# Patient Record
Sex: Male | Born: 1970 | Hispanic: No | Marital: Single | Smoking: Never smoker
Health system: Southern US, Community
[De-identification: ages and names within clinical notes are randomized; demographics above are authoritative.]

## PROBLEM LIST (undated history)

## (undated) DIAGNOSIS — I2089 Other forms of angina pectoris: Secondary | ICD-10-CM

## (undated) DIAGNOSIS — G4700 Insomnia, unspecified: Secondary | ICD-10-CM

## (undated) DIAGNOSIS — E269 Hyperaldosteronism, unspecified: Secondary | ICD-10-CM

## (undated) DIAGNOSIS — I10 Essential (primary) hypertension: Secondary | ICD-10-CM

## (undated) DIAGNOSIS — F419 Anxiety disorder, unspecified: Secondary | ICD-10-CM

## (undated) DIAGNOSIS — Z8659 Personal history of other mental and behavioral disorders: Secondary | ICD-10-CM

## (undated) DIAGNOSIS — E785 Hyperlipidemia, unspecified: Secondary | ICD-10-CM

## (undated) DIAGNOSIS — G8929 Other chronic pain: Secondary | ICD-10-CM

## (undated) DIAGNOSIS — R002 Palpitations: Secondary | ICD-10-CM

## (undated) DIAGNOSIS — E119 Type 2 diabetes mellitus without complications: Secondary | ICD-10-CM

## (undated) DIAGNOSIS — I208 Other forms of angina pectoris: Secondary | ICD-10-CM

## (undated) DIAGNOSIS — F410 Panic disorder [episodic paroxysmal anxiety] without agoraphobia: Secondary | ICD-10-CM

## (undated) DIAGNOSIS — R109 Unspecified abdominal pain: Secondary | ICD-10-CM

## (undated) HISTORY — DX: Palpitations: R00.2

## (undated) HISTORY — DX: Panic disorder (episodic paroxysmal anxiety): F41.0

## (undated) HISTORY — DX: Insomnia, unspecified: G47.00

## (undated) HISTORY — DX: Type 2 diabetes mellitus without complications: E11.9

## (undated) HISTORY — DX: Unspecified abdominal pain: R10.9

## (undated) HISTORY — DX: Hyperlipidemia, unspecified: E78.5

## (undated) HISTORY — DX: Hyperaldosteronism, unspecified: E26.9

## (undated) HISTORY — DX: Essential (primary) hypertension: I10

## (undated) HISTORY — DX: Other forms of angina pectoris: I20.89

## (undated) HISTORY — DX: Anxiety disorder, unspecified: F41.9

## (undated) HISTORY — DX: Other chronic pain: G89.29

## (undated) HISTORY — DX: Other forms of angina pectoris: I20.8

## (undated) HISTORY — DX: Personal history of other mental and behavioral disorders: Z86.59

---

## 2004-05-10 HISTORY — PX: NASAL SINUS SURGERY: SHX719

## 2008-03-21 ENCOUNTER — Emergency Department (HOSPITAL_COMMUNITY): Admission: EM | Admit: 2008-03-21 | Discharge: 2008-03-21 | Payer: Self-pay | Admitting: Emergency Medicine

## 2011-02-09 LAB — DIFFERENTIAL
Basophils Absolute: 0
Basophils Relative: 0
Eosinophils Absolute: 0.1
Neutro Abs: 5.3
Neutrophils Relative %: 59

## 2011-02-09 LAB — CBC
MCHC: 33.3
MCV: 89.8
Platelets: 269

## 2011-02-09 LAB — POCT I-STAT, CHEM 8
Chloride: 104
HCT: 47
Hemoglobin: 16
Potassium: 3.7
Sodium: 138

## 2013-02-16 ENCOUNTER — Ambulatory Visit (HOSPITAL_BASED_OUTPATIENT_CLINIC_OR_DEPARTMENT_OTHER): Payer: BC Managed Care – PPO

## 2013-02-16 ENCOUNTER — Ambulatory Visit (HOSPITAL_COMMUNITY): Payer: BC Managed Care – PPO | Attending: Internal Medicine | Admitting: Radiology

## 2013-02-16 ENCOUNTER — Other Ambulatory Visit (HOSPITAL_COMMUNITY): Payer: Self-pay | Admitting: Interventional Cardiology

## 2013-02-16 DIAGNOSIS — I10 Essential (primary) hypertension: Secondary | ICD-10-CM | POA: Insufficient documentation

## 2013-02-16 DIAGNOSIS — Z01810 Encounter for preprocedural cardiovascular examination: Secondary | ICD-10-CM

## 2013-02-16 DIAGNOSIS — R0989 Other specified symptoms and signs involving the circulatory and respiratory systems: Secondary | ICD-10-CM

## 2013-02-16 DIAGNOSIS — R079 Chest pain, unspecified: Secondary | ICD-10-CM

## 2013-02-16 DIAGNOSIS — R002 Palpitations: Secondary | ICD-10-CM | POA: Insufficient documentation

## 2013-02-16 DIAGNOSIS — R072 Precordial pain: Secondary | ICD-10-CM

## 2013-02-16 NOTE — Progress Notes (Signed)
Stress Echocardiogram performed.  

## 2013-10-08 ENCOUNTER — Ambulatory Visit
Admission: RE | Admit: 2013-10-08 | Discharge: 2013-10-08 | Disposition: A | Discharge status: Home / Self Care | Payer: BC Managed Care – PPO | Source: Ambulatory Visit | Attending: Family Medicine | Admitting: Family Medicine

## 2013-10-08 ENCOUNTER — Other Ambulatory Visit: Payer: Self-pay | Admitting: Family Medicine

## 2013-10-08 DIAGNOSIS — R059 Cough, unspecified: Secondary | ICD-10-CM

## 2013-10-08 DIAGNOSIS — R05 Cough: Secondary | ICD-10-CM

## 2013-11-13 ENCOUNTER — Ambulatory Visit
Admission: RE | Admit: 2013-11-13 | Discharge: 2013-11-13 | Disposition: A | Discharge status: Home / Self Care | Payer: BC Managed Care – PPO | Source: Ambulatory Visit | Attending: Family Medicine | Admitting: Family Medicine

## 2013-11-13 ENCOUNTER — Other Ambulatory Visit: Payer: Self-pay | Admitting: Family Medicine

## 2013-11-13 DIAGNOSIS — R9389 Abnormal findings on diagnostic imaging of other specified body structures: Secondary | ICD-10-CM

## 2015-08-21 DIAGNOSIS — I10 Essential (primary) hypertension: Secondary | ICD-10-CM | POA: Diagnosis not present

## 2015-08-21 DIAGNOSIS — R079 Chest pain, unspecified: Secondary | ICD-10-CM | POA: Diagnosis not present

## 2015-08-21 DIAGNOSIS — E785 Hyperlipidemia, unspecified: Secondary | ICD-10-CM | POA: Diagnosis not present

## 2015-08-28 ENCOUNTER — Ambulatory Visit
Admission: RE | Admit: 2015-08-28 | Discharge: 2015-08-28 | Disposition: A | Discharge status: Home / Self Care | Payer: Self-pay | Source: Ambulatory Visit | Attending: Internal Medicine | Admitting: Internal Medicine

## 2015-08-28 ENCOUNTER — Other Ambulatory Visit: Payer: Self-pay | Admitting: Internal Medicine

## 2015-08-28 DIAGNOSIS — R682 Dry mouth, unspecified: Secondary | ICD-10-CM

## 2015-08-28 DIAGNOSIS — R232 Flushing: Secondary | ICD-10-CM

## 2015-08-28 DIAGNOSIS — R05 Cough: Secondary | ICD-10-CM | POA: Diagnosis not present

## 2015-08-28 DIAGNOSIS — R9389 Abnormal findings on diagnostic imaging of other specified body structures: Secondary | ICD-10-CM

## 2015-08-28 DIAGNOSIS — H04129 Dry eye syndrome of unspecified lacrimal gland: Secondary | ICD-10-CM

## 2015-08-28 DIAGNOSIS — R195 Other fecal abnormalities: Secondary | ICD-10-CM | POA: Diagnosis not present

## 2015-09-04 DIAGNOSIS — R232 Flushing: Secondary | ICD-10-CM | POA: Diagnosis not present

## 2015-09-04 DIAGNOSIS — R195 Other fecal abnormalities: Secondary | ICD-10-CM | POA: Diagnosis not present

## 2015-09-04 DIAGNOSIS — R682 Dry mouth, unspecified: Secondary | ICD-10-CM | POA: Diagnosis not present

## 2015-09-04 DIAGNOSIS — I10 Essential (primary) hypertension: Secondary | ICD-10-CM | POA: Diagnosis not present

## 2015-09-04 DIAGNOSIS — H04129 Dry eye syndrome of unspecified lacrimal gland: Secondary | ICD-10-CM | POA: Diagnosis not present

## 2015-09-08 DIAGNOSIS — R601 Generalized edema: Secondary | ICD-10-CM | POA: Diagnosis not present

## 2015-09-08 DIAGNOSIS — S142XXA Injury of nerve root of cervical spine, initial encounter: Secondary | ICD-10-CM | POA: Diagnosis not present

## 2015-09-09 DIAGNOSIS — S242XXA Injury of nerve root of thoracic spine, initial encounter: Secondary | ICD-10-CM | POA: Diagnosis not present

## 2015-09-09 DIAGNOSIS — S142XXA Injury of nerve root of cervical spine, initial encounter: Secondary | ICD-10-CM | POA: Diagnosis not present

## 2015-09-09 DIAGNOSIS — R601 Generalized edema: Secondary | ICD-10-CM | POA: Diagnosis not present

## 2015-09-10 DIAGNOSIS — R601 Generalized edema: Secondary | ICD-10-CM | POA: Diagnosis not present

## 2015-09-10 DIAGNOSIS — S142XXA Injury of nerve root of cervical spine, initial encounter: Secondary | ICD-10-CM | POA: Diagnosis not present

## 2015-09-11 DIAGNOSIS — S242XXA Injury of nerve root of thoracic spine, initial encounter: Secondary | ICD-10-CM | POA: Diagnosis not present

## 2015-09-11 DIAGNOSIS — R601 Generalized edema: Secondary | ICD-10-CM | POA: Diagnosis not present

## 2015-09-11 DIAGNOSIS — S142XXA Injury of nerve root of cervical spine, initial encounter: Secondary | ICD-10-CM | POA: Diagnosis not present

## 2015-09-15 DIAGNOSIS — R601 Generalized edema: Secondary | ICD-10-CM | POA: Diagnosis not present

## 2015-09-15 DIAGNOSIS — S142XXA Injury of nerve root of cervical spine, initial encounter: Secondary | ICD-10-CM | POA: Diagnosis not present

## 2015-09-15 DIAGNOSIS — S242XXA Injury of nerve root of thoracic spine, initial encounter: Secondary | ICD-10-CM | POA: Diagnosis not present

## 2015-09-17 DIAGNOSIS — S142XXA Injury of nerve root of cervical spine, initial encounter: Secondary | ICD-10-CM | POA: Diagnosis not present

## 2015-09-17 DIAGNOSIS — S242XXA Injury of nerve root of thoracic spine, initial encounter: Secondary | ICD-10-CM | POA: Diagnosis not present

## 2015-09-17 DIAGNOSIS — R601 Generalized edema: Secondary | ICD-10-CM | POA: Diagnosis not present

## 2015-09-18 DIAGNOSIS — S142XXA Injury of nerve root of cervical spine, initial encounter: Secondary | ICD-10-CM | POA: Diagnosis not present

## 2015-09-18 DIAGNOSIS — M5416 Radiculopathy, lumbar region: Secondary | ICD-10-CM | POA: Diagnosis not present

## 2015-09-18 DIAGNOSIS — R601 Generalized edema: Secondary | ICD-10-CM | POA: Diagnosis not present

## 2015-09-22 DIAGNOSIS — R601 Generalized edema: Secondary | ICD-10-CM | POA: Diagnosis not present

## 2015-09-22 DIAGNOSIS — S242XXA Injury of nerve root of thoracic spine, initial encounter: Secondary | ICD-10-CM | POA: Diagnosis not present

## 2015-09-22 DIAGNOSIS — M5106 Intervertebral disc disorders with myelopathy, lumbar region: Secondary | ICD-10-CM | POA: Diagnosis not present

## 2015-09-22 DIAGNOSIS — S142XXA Injury of nerve root of cervical spine, initial encounter: Secondary | ICD-10-CM | POA: Diagnosis not present

## 2015-09-24 DIAGNOSIS — M5416 Radiculopathy, lumbar region: Secondary | ICD-10-CM | POA: Diagnosis not present

## 2015-09-24 DIAGNOSIS — R601 Generalized edema: Secondary | ICD-10-CM | POA: Diagnosis not present

## 2015-09-24 DIAGNOSIS — S242XXA Injury of nerve root of thoracic spine, initial encounter: Secondary | ICD-10-CM | POA: Diagnosis not present

## 2015-09-24 DIAGNOSIS — S142XXA Injury of nerve root of cervical spine, initial encounter: Secondary | ICD-10-CM | POA: Diagnosis not present

## 2015-09-25 DIAGNOSIS — R601 Generalized edema: Secondary | ICD-10-CM | POA: Diagnosis not present

## 2015-09-25 DIAGNOSIS — M5106 Intervertebral disc disorders with myelopathy, lumbar region: Secondary | ICD-10-CM | POA: Diagnosis not present

## 2015-09-25 DIAGNOSIS — M5416 Radiculopathy, lumbar region: Secondary | ICD-10-CM | POA: Diagnosis not present

## 2015-09-25 DIAGNOSIS — S142XXA Injury of nerve root of cervical spine, initial encounter: Secondary | ICD-10-CM | POA: Diagnosis not present

## 2015-09-29 DIAGNOSIS — S142XXA Injury of nerve root of cervical spine, initial encounter: Secondary | ICD-10-CM | POA: Diagnosis not present

## 2015-09-29 DIAGNOSIS — R601 Generalized edema: Secondary | ICD-10-CM | POA: Diagnosis not present

## 2015-09-29 DIAGNOSIS — M5416 Radiculopathy, lumbar region: Secondary | ICD-10-CM | POA: Diagnosis not present

## 2015-09-30 DIAGNOSIS — I10 Essential (primary) hypertension: Secondary | ICD-10-CM | POA: Diagnosis not present

## 2015-09-30 DIAGNOSIS — E876 Hypokalemia: Secondary | ICD-10-CM | POA: Diagnosis not present

## 2015-09-30 DIAGNOSIS — R799 Abnormal finding of blood chemistry, unspecified: Secondary | ICD-10-CM | POA: Diagnosis not present

## 2015-10-01 DIAGNOSIS — S242XXA Injury of nerve root of thoracic spine, initial encounter: Secondary | ICD-10-CM | POA: Diagnosis not present

## 2015-10-01 DIAGNOSIS — M5106 Intervertebral disc disorders with myelopathy, lumbar region: Secondary | ICD-10-CM | POA: Diagnosis not present

## 2015-10-01 DIAGNOSIS — S142XXA Injury of nerve root of cervical spine, initial encounter: Secondary | ICD-10-CM | POA: Diagnosis not present

## 2015-10-01 DIAGNOSIS — R601 Generalized edema: Secondary | ICD-10-CM | POA: Diagnosis not present

## 2015-10-01 DIAGNOSIS — M5416 Radiculopathy, lumbar region: Secondary | ICD-10-CM | POA: Diagnosis not present

## 2015-10-02 DIAGNOSIS — S242XXA Injury of nerve root of thoracic spine, initial encounter: Secondary | ICD-10-CM | POA: Diagnosis not present

## 2015-10-02 DIAGNOSIS — S142XXA Injury of nerve root of cervical spine, initial encounter: Secondary | ICD-10-CM | POA: Diagnosis not present

## 2015-10-02 DIAGNOSIS — R601 Generalized edema: Secondary | ICD-10-CM | POA: Diagnosis not present

## 2015-10-08 DIAGNOSIS — S242XXA Injury of nerve root of thoracic spine, initial encounter: Secondary | ICD-10-CM | POA: Diagnosis not present

## 2015-10-08 DIAGNOSIS — M5416 Radiculopathy, lumbar region: Secondary | ICD-10-CM | POA: Diagnosis not present

## 2015-10-08 DIAGNOSIS — M5106 Intervertebral disc disorders with myelopathy, lumbar region: Secondary | ICD-10-CM | POA: Diagnosis not present

## 2015-10-08 DIAGNOSIS — R601 Generalized edema: Secondary | ICD-10-CM | POA: Diagnosis not present

## 2015-10-08 DIAGNOSIS — S142XXA Injury of nerve root of cervical spine, initial encounter: Secondary | ICD-10-CM | POA: Diagnosis not present

## 2015-10-09 DIAGNOSIS — S242XXA Injury of nerve root of thoracic spine, initial encounter: Secondary | ICD-10-CM | POA: Diagnosis not present

## 2015-10-09 DIAGNOSIS — M5106 Intervertebral disc disorders with myelopathy, lumbar region: Secondary | ICD-10-CM | POA: Diagnosis not present

## 2015-10-09 DIAGNOSIS — S142XXA Injury of nerve root of cervical spine, initial encounter: Secondary | ICD-10-CM | POA: Diagnosis not present

## 2015-10-09 DIAGNOSIS — M5416 Radiculopathy, lumbar region: Secondary | ICD-10-CM | POA: Diagnosis not present

## 2015-10-09 DIAGNOSIS — R601 Generalized edema: Secondary | ICD-10-CM | POA: Diagnosis not present

## 2015-10-13 DIAGNOSIS — S142XXA Injury of nerve root of cervical spine, initial encounter: Secondary | ICD-10-CM | POA: Diagnosis not present

## 2015-10-13 DIAGNOSIS — M5106 Intervertebral disc disorders with myelopathy, lumbar region: Secondary | ICD-10-CM | POA: Diagnosis not present

## 2015-10-13 DIAGNOSIS — S242XXA Injury of nerve root of thoracic spine, initial encounter: Secondary | ICD-10-CM | POA: Diagnosis not present

## 2015-10-13 DIAGNOSIS — M5416 Radiculopathy, lumbar region: Secondary | ICD-10-CM | POA: Diagnosis not present

## 2015-10-13 DIAGNOSIS — R601 Generalized edema: Secondary | ICD-10-CM | POA: Diagnosis not present

## 2015-10-15 DIAGNOSIS — R601 Generalized edema: Secondary | ICD-10-CM | POA: Diagnosis not present

## 2015-10-15 DIAGNOSIS — S142XXA Injury of nerve root of cervical spine, initial encounter: Secondary | ICD-10-CM | POA: Diagnosis not present

## 2015-10-16 DIAGNOSIS — M9902 Segmental and somatic dysfunction of thoracic region: Secondary | ICD-10-CM | POA: Diagnosis not present

## 2015-10-16 DIAGNOSIS — S142XXA Injury of nerve root of cervical spine, initial encounter: Secondary | ICD-10-CM | POA: Diagnosis not present

## 2015-10-16 DIAGNOSIS — R601 Generalized edema: Secondary | ICD-10-CM | POA: Diagnosis not present

## 2015-10-16 DIAGNOSIS — S242XXA Injury of nerve root of thoracic spine, initial encounter: Secondary | ICD-10-CM | POA: Diagnosis not present

## 2015-10-16 DIAGNOSIS — M5106 Intervertebral disc disorders with myelopathy, lumbar region: Secondary | ICD-10-CM | POA: Diagnosis not present

## 2015-10-16 DIAGNOSIS — M9901 Segmental and somatic dysfunction of cervical region: Secondary | ICD-10-CM | POA: Diagnosis not present

## 2015-10-16 DIAGNOSIS — M791 Myalgia: Secondary | ICD-10-CM | POA: Diagnosis not present

## 2015-10-17 DIAGNOSIS — R601 Generalized edema: Secondary | ICD-10-CM | POA: Diagnosis not present

## 2015-10-17 DIAGNOSIS — S142XXA Injury of nerve root of cervical spine, initial encounter: Secondary | ICD-10-CM | POA: Diagnosis not present

## 2015-10-21 DIAGNOSIS — R799 Abnormal finding of blood chemistry, unspecified: Secondary | ICD-10-CM | POA: Diagnosis not present

## 2015-10-21 DIAGNOSIS — E876 Hypokalemia: Secondary | ICD-10-CM | POA: Diagnosis not present

## 2015-10-27 DIAGNOSIS — M542 Cervicalgia: Secondary | ICD-10-CM | POA: Diagnosis not present

## 2015-10-27 DIAGNOSIS — M5134 Other intervertebral disc degeneration, thoracic region: Secondary | ICD-10-CM | POA: Diagnosis not present

## 2015-10-27 DIAGNOSIS — G44201 Tension-type headache, unspecified, intractable: Secondary | ICD-10-CM | POA: Diagnosis not present

## 2015-10-27 DIAGNOSIS — M50322 Other cervical disc degeneration at C5-C6 level: Secondary | ICD-10-CM | POA: Diagnosis not present

## 2015-10-29 DIAGNOSIS — M5134 Other intervertebral disc degeneration, thoracic region: Secondary | ICD-10-CM | POA: Diagnosis not present

## 2015-10-29 DIAGNOSIS — G44201 Tension-type headache, unspecified, intractable: Secondary | ICD-10-CM | POA: Diagnosis not present

## 2015-10-29 DIAGNOSIS — M50322 Other cervical disc degeneration at C5-C6 level: Secondary | ICD-10-CM | POA: Diagnosis not present

## 2015-10-29 DIAGNOSIS — M542 Cervicalgia: Secondary | ICD-10-CM | POA: Diagnosis not present

## 2015-10-30 DIAGNOSIS — G44201 Tension-type headache, unspecified, intractable: Secondary | ICD-10-CM | POA: Diagnosis not present

## 2015-10-30 DIAGNOSIS — M542 Cervicalgia: Secondary | ICD-10-CM | POA: Diagnosis not present

## 2015-10-30 DIAGNOSIS — M50322 Other cervical disc degeneration at C5-C6 level: Secondary | ICD-10-CM | POA: Diagnosis not present

## 2015-10-30 DIAGNOSIS — M5134 Other intervertebral disc degeneration, thoracic region: Secondary | ICD-10-CM | POA: Diagnosis not present

## 2015-11-03 DIAGNOSIS — M542 Cervicalgia: Secondary | ICD-10-CM | POA: Diagnosis not present

## 2015-11-03 DIAGNOSIS — G44201 Tension-type headache, unspecified, intractable: Secondary | ICD-10-CM | POA: Diagnosis not present

## 2015-11-03 DIAGNOSIS — M50322 Other cervical disc degeneration at C5-C6 level: Secondary | ICD-10-CM | POA: Diagnosis not present

## 2015-11-03 DIAGNOSIS — M5134 Other intervertebral disc degeneration, thoracic region: Secondary | ICD-10-CM | POA: Diagnosis not present

## 2015-11-06 DIAGNOSIS — M50322 Other cervical disc degeneration at C5-C6 level: Secondary | ICD-10-CM | POA: Diagnosis not present

## 2015-11-06 DIAGNOSIS — G44201 Tension-type headache, unspecified, intractable: Secondary | ICD-10-CM | POA: Diagnosis not present

## 2015-11-06 DIAGNOSIS — M542 Cervicalgia: Secondary | ICD-10-CM | POA: Diagnosis not present

## 2015-11-06 DIAGNOSIS — M5134 Other intervertebral disc degeneration, thoracic region: Secondary | ICD-10-CM | POA: Diagnosis not present

## 2015-11-17 DIAGNOSIS — M50322 Other cervical disc degeneration at C5-C6 level: Secondary | ICD-10-CM | POA: Diagnosis not present

## 2015-11-17 DIAGNOSIS — M5134 Other intervertebral disc degeneration, thoracic region: Secondary | ICD-10-CM | POA: Diagnosis not present

## 2015-11-17 DIAGNOSIS — G44201 Tension-type headache, unspecified, intractable: Secondary | ICD-10-CM | POA: Diagnosis not present

## 2015-11-18 ENCOUNTER — Other Ambulatory Visit: Payer: Self-pay | Admitting: Internal Medicine

## 2015-11-18 DIAGNOSIS — E876 Hypokalemia: Secondary | ICD-10-CM | POA: Diagnosis not present

## 2015-11-18 DIAGNOSIS — I10 Essential (primary) hypertension: Secondary | ICD-10-CM | POA: Diagnosis not present

## 2015-11-18 DIAGNOSIS — R7301 Impaired fasting glucose: Secondary | ICD-10-CM | POA: Diagnosis not present

## 2015-11-18 DIAGNOSIS — E2609 Other primary hyperaldosteronism: Secondary | ICD-10-CM | POA: Diagnosis not present

## 2015-11-20 DIAGNOSIS — G44201 Tension-type headache, unspecified, intractable: Secondary | ICD-10-CM | POA: Diagnosis not present

## 2015-11-20 DIAGNOSIS — M542 Cervicalgia: Secondary | ICD-10-CM | POA: Diagnosis not present

## 2015-11-20 DIAGNOSIS — M5134 Other intervertebral disc degeneration, thoracic region: Secondary | ICD-10-CM | POA: Diagnosis not present

## 2015-11-28 ENCOUNTER — Other Ambulatory Visit: Payer: BLUE CROSS/BLUE SHIELD

## 2015-11-28 DIAGNOSIS — E785 Hyperlipidemia, unspecified: Secondary | ICD-10-CM | POA: Diagnosis not present

## 2015-11-28 DIAGNOSIS — Z0000 Encounter for general adult medical examination without abnormal findings: Secondary | ICD-10-CM | POA: Diagnosis not present

## 2015-11-28 DIAGNOSIS — R7301 Impaired fasting glucose: Secondary | ICD-10-CM | POA: Diagnosis not present

## 2015-11-28 DIAGNOSIS — E2609 Other primary hyperaldosteronism: Secondary | ICD-10-CM | POA: Diagnosis not present

## 2015-12-01 ENCOUNTER — Other Ambulatory Visit: Payer: BLUE CROSS/BLUE SHIELD

## 2015-12-01 DIAGNOSIS — G959 Disease of spinal cord, unspecified: Secondary | ICD-10-CM | POA: Diagnosis not present

## 2015-12-01 DIAGNOSIS — M542 Cervicalgia: Secondary | ICD-10-CM | POA: Diagnosis not present

## 2015-12-01 DIAGNOSIS — M5412 Radiculopathy, cervical region: Secondary | ICD-10-CM | POA: Diagnosis not present

## 2015-12-03 ENCOUNTER — Ambulatory Visit
Admission: RE | Admit: 2015-12-03 | Discharge: 2015-12-03 | Disposition: A | Discharge status: Home / Self Care | Payer: BLUE CROSS/BLUE SHIELD | Source: Ambulatory Visit | Attending: Internal Medicine | Admitting: Internal Medicine

## 2015-12-03 DIAGNOSIS — E2609 Other primary hyperaldosteronism: Secondary | ICD-10-CM

## 2015-12-03 DIAGNOSIS — N200 Calculus of kidney: Secondary | ICD-10-CM | POA: Diagnosis not present

## 2015-12-05 DIAGNOSIS — M4802 Spinal stenosis, cervical region: Secondary | ICD-10-CM | POA: Diagnosis not present

## 2015-12-05 DIAGNOSIS — G959 Disease of spinal cord, unspecified: Secondary | ICD-10-CM | POA: Diagnosis not present

## 2015-12-17 DIAGNOSIS — R7301 Impaired fasting glucose: Secondary | ICD-10-CM | POA: Diagnosis not present

## 2015-12-24 DIAGNOSIS — E876 Hypokalemia: Secondary | ICD-10-CM | POA: Diagnosis not present

## 2015-12-24 DIAGNOSIS — I10 Essential (primary) hypertension: Secondary | ICD-10-CM | POA: Diagnosis not present

## 2015-12-24 DIAGNOSIS — E2609 Other primary hyperaldosteronism: Secondary | ICD-10-CM | POA: Diagnosis not present

## 2015-12-24 DIAGNOSIS — N200 Calculus of kidney: Secondary | ICD-10-CM | POA: Diagnosis not present

## 2015-12-31 DIAGNOSIS — E2609 Other primary hyperaldosteronism: Secondary | ICD-10-CM | POA: Diagnosis not present

## 2016-01-13 DIAGNOSIS — N200 Calculus of kidney: Secondary | ICD-10-CM | POA: Diagnosis not present

## 2016-01-14 DIAGNOSIS — M25561 Pain in right knee: Secondary | ICD-10-CM | POA: Diagnosis not present

## 2016-01-28 DIAGNOSIS — G959 Disease of spinal cord, unspecified: Secondary | ICD-10-CM | POA: Diagnosis not present

## 2016-01-28 DIAGNOSIS — M5412 Radiculopathy, cervical region: Secondary | ICD-10-CM | POA: Diagnosis not present

## 2016-01-28 DIAGNOSIS — M542 Cervicalgia: Secondary | ICD-10-CM | POA: Diagnosis not present

## 2016-01-28 DIAGNOSIS — Z6829 Body mass index (BMI) 29.0-29.9, adult: Secondary | ICD-10-CM | POA: Diagnosis not present

## 2016-03-01 DIAGNOSIS — R51 Headache: Secondary | ICD-10-CM | POA: Diagnosis not present

## 2016-05-04 DIAGNOSIS — Z87898 Personal history of other specified conditions: Secondary | ICD-10-CM | POA: Diagnosis not present

## 2016-05-04 DIAGNOSIS — E876 Hypokalemia: Secondary | ICD-10-CM | POA: Diagnosis not present

## 2016-05-04 DIAGNOSIS — E2609 Other primary hyperaldosteronism: Secondary | ICD-10-CM | POA: Diagnosis not present

## 2016-05-04 DIAGNOSIS — K8020 Calculus of gallbladder without cholecystitis without obstruction: Secondary | ICD-10-CM | POA: Diagnosis not present

## 2016-05-04 DIAGNOSIS — N200 Calculus of kidney: Secondary | ICD-10-CM | POA: Diagnosis not present

## 2016-05-18 DIAGNOSIS — R079 Chest pain, unspecified: Secondary | ICD-10-CM | POA: Diagnosis not present

## 2016-05-18 DIAGNOSIS — I10 Essential (primary) hypertension: Secondary | ICD-10-CM | POA: Diagnosis not present

## 2016-05-19 ENCOUNTER — Telehealth: Payer: Self-pay

## 2016-05-19 NOTE — Telephone Encounter (Signed)
SENT NOTES TO SCHEDULING 

## 2016-06-21 DIAGNOSIS — I10 Essential (primary) hypertension: Secondary | ICD-10-CM | POA: Diagnosis not present

## 2016-07-09 DIAGNOSIS — E785 Hyperlipidemia, unspecified: Secondary | ICD-10-CM | POA: Diagnosis not present

## 2016-07-26 DIAGNOSIS — I10 Essential (primary) hypertension: Secondary | ICD-10-CM | POA: Diagnosis not present

## 2016-10-20 DIAGNOSIS — H60503 Unspecified acute noninfective otitis externa, bilateral: Secondary | ICD-10-CM | POA: Diagnosis not present

## 2016-10-20 DIAGNOSIS — I10 Essential (primary) hypertension: Secondary | ICD-10-CM | POA: Diagnosis not present

## 2016-10-20 DIAGNOSIS — N62 Hypertrophy of breast: Secondary | ICD-10-CM | POA: Diagnosis not present

## 2016-10-22 ENCOUNTER — Other Ambulatory Visit: Payer: Self-pay | Admitting: Family Medicine

## 2016-10-22 DIAGNOSIS — N63 Unspecified lump in unspecified breast: Secondary | ICD-10-CM

## 2016-10-22 DIAGNOSIS — N644 Mastodynia: Secondary | ICD-10-CM

## 2016-10-25 ENCOUNTER — Other Ambulatory Visit: Payer: Self-pay | Admitting: Gastroenterology

## 2016-10-27 ENCOUNTER — Ambulatory Visit
Admission: RE | Admit: 2016-10-27 | Discharge: 2016-10-27 | Disposition: A | Discharge status: Home / Self Care | Payer: BLUE CROSS/BLUE SHIELD | Source: Ambulatory Visit | Attending: Family Medicine | Admitting: Family Medicine

## 2016-10-27 DIAGNOSIS — N63 Unspecified lump in unspecified breast: Secondary | ICD-10-CM

## 2016-10-27 DIAGNOSIS — N644 Mastodynia: Secondary | ICD-10-CM

## 2016-10-27 DIAGNOSIS — R928 Other abnormal and inconclusive findings on diagnostic imaging of breast: Secondary | ICD-10-CM | POA: Diagnosis not present

## 2016-10-27 DIAGNOSIS — N62 Hypertrophy of breast: Secondary | ICD-10-CM | POA: Diagnosis not present

## 2016-11-15 DIAGNOSIS — R079 Chest pain, unspecified: Secondary | ICD-10-CM | POA: Diagnosis not present

## 2016-11-18 ENCOUNTER — Other Ambulatory Visit: Payer: Self-pay | Admitting: Gastroenterology

## 2016-11-18 DIAGNOSIS — R079 Chest pain, unspecified: Secondary | ICD-10-CM

## 2016-11-23 ENCOUNTER — Telehealth: Payer: Self-pay

## 2016-11-23 ENCOUNTER — Encounter: Payer: Self-pay | Admitting: Nurse Practitioner

## 2016-11-23 ENCOUNTER — Ambulatory Visit: Payer: BLUE CROSS/BLUE SHIELD | Attending: Nurse Practitioner | Admitting: Nurse Practitioner

## 2016-11-23 VITALS — BP 138/83 | HR 86 | Temp 98.1°F | Resp 16 | Ht 68.0 in | Wt 192.0 lb

## 2016-11-23 DIAGNOSIS — M5481 Occipital neuralgia: Secondary | ICD-10-CM | POA: Diagnosis not present

## 2016-11-23 DIAGNOSIS — R200 Anesthesia of skin: Secondary | ICD-10-CM | POA: Diagnosis not present

## 2016-11-23 DIAGNOSIS — Z79899 Other long term (current) drug therapy: Secondary | ICD-10-CM | POA: Insufficient documentation

## 2016-11-23 DIAGNOSIS — R51 Headache: Secondary | ICD-10-CM

## 2016-11-23 DIAGNOSIS — F1190 Opioid use, unspecified, uncomplicated: Secondary | ICD-10-CM | POA: Diagnosis not present

## 2016-11-23 DIAGNOSIS — R519 Headache, unspecified: Secondary | ICD-10-CM

## 2016-11-23 DIAGNOSIS — Z87442 Personal history of urinary calculi: Secondary | ICD-10-CM | POA: Insufficient documentation

## 2016-11-23 DIAGNOSIS — Z79891 Long term (current) use of opiate analgesic: Secondary | ICD-10-CM | POA: Insufficient documentation

## 2016-11-23 DIAGNOSIS — M542 Cervicalgia: Secondary | ICD-10-CM | POA: Insufficient documentation

## 2016-11-23 DIAGNOSIS — I10 Essential (primary) hypertension: Secondary | ICD-10-CM | POA: Diagnosis not present

## 2016-11-23 DIAGNOSIS — G8929 Other chronic pain: Secondary | ICD-10-CM

## 2016-11-23 DIAGNOSIS — G894 Chronic pain syndrome: Secondary | ICD-10-CM | POA: Insufficient documentation

## 2016-11-23 DIAGNOSIS — K219 Gastro-esophageal reflux disease without esophagitis: Secondary | ICD-10-CM | POA: Diagnosis not present

## 2016-11-23 NOTE — Telephone Encounter (Signed)
Faxed auth of release form to [facility] for pt medical records

## 2016-11-23 NOTE — Progress Notes (Addendum)
Patient's Name: Maxwell Richards  MRN: [id number]  Referring Provider: Normajean Glasgow, MD  DOB: 18-Jul-1970  PCP: Maurice Small, MD  DOS: 11/23/2016  Note by: Dionisio David NP  Service setting: Ambulatory outpatient  Specialty: Interventional Pain Management  Location: [facility] (AMB) Pain Management Facility    Patient type: New Patient    Primary Reason(s) for Visit: Initial Patient Evaluation CC: Neck Pain (occipital neuralgia)  HPI  Mr. Butrick is a 46 y.o. year old, male patient, who comes today for an initial evaluation. He has Opiate use; Long term current use of opiate analgesic; Long term prescription benzodiazepine use; Chronic pain syndrome; Chronic neck pain (Secondary Area of pain) (Bilateral) (L>R); Chronic head pain (Primary Area of Pain) (Left); Chronic shoulder pain [facility] Area of pain) (Bilateral) (L>R); Cervicogenic headache; Occipital headache; and Radicular pain of shoulder on his problem list.. His primarily concern today is the Neck Pain (occipital neuralgia)  Pain Assessment: Location:   Neck Radiating: back of head and to back of both eyes Onset: More than a month ago Duration: Chronic pain Quality: Pressure, Cramping, Constant Severity: 2 /10 (self-reported pain score)  Note: Reported level is compatible with observation.                   Effect on ADL:   Timing: Constant Modifying factors: nerve block, chiropractic manipulations  Onset and Duration: Sudden, Date of injury: january 1990 and Present longer than 3 months Cause of pain: Head hit bunk bed in college dormitory Severity: No change since onset, NAS-11 at its worse: 10/10, NAS-11 at its best: 2/10, NAS-11 now: 2/10 and NAS-11 on the average: 3/10 Timing: Not influenced by the time of the day Aggravating Factors: Motion Alleviating Factors: Nerve blocks and Chiropractic manipulations Associated Problems: Numbness, tingling, excessive dryness Quality of Pain: Annoying, Cramping, Dull, Pressure-like and  Uncomfortable Previous Examinations or Tests: MRI scan, Nerve block, X-rays and Chiropractic evaluation Previous Treatments: Chiropractic manipulations and Stretching exercises  The patient comes into the clinics today for the first time for a chronic pain management evaluation. According to the patient's primary area of pain is in the top of his head. He admitted that at the age of 20 he admits that he suffered a blow to this area while getting up from the lower level of a bunk bed. He admits that he tolerated the pain for several years and was seen by many eye doctors for pain behind his eyes. He admits that he did have a surgical procedure by plastic surgeon to look at the occipital nerve . He admits that it was initially going to be cut the nerve to help relieve his pain. He admits that he has numbness with radicular pain that goes around his face behind his eye. The pain also radiates down into his neck with tightness in the shoulders. He admits that he has ringing in ear. The left side is greater than the right. He denies any interventional procedures. He has had Chinese coin scraping which made the pain worse. He currently uses an activator method technique used by chiropractors. He states this is effective for a while but then returns.  His second area of pain is his neck. He admits the left is greater than the right. He states that he does radiate down into her shoulders. He denies any previous procedures, physical therapy but has had recent images.  Third area of pain is in his shoulders with left being greater than the right. He denies any previous  surgeries, physical therapy. He admits that he has had x-rays of his shoulders.  Today I took the time to provide the patient with information regarding this pain practice. The patient was informed that the practice is divided into two sections: an interventional pain management section, as well as a completely separate and distinct medication  management section. I explained that there are procedure days for interventional therapies, and evaluation days for follow-ups and medication management. Because of the amount of documentation required during both, they are kept separated. This means that there is the possibility that he may be scheduled for a procedure on one day, and medication management the next. I have also informed him that because of staffing and facility limitations, this practice will no longer take patients for medication management only. To illustrate the reasons for this, I gave the patient the example of surgeons, and how inappropriate it would be to refer a patient to his/her care, just to write for the post-surgical antibiotics on a surgery done by a different surgeon.   Because interventional pain management is part of the board-certified specialty for the doctors, the patient was informed that joining this practice means that they are open to any and all interventional therapies. I made it clear that this does not mean that they will be forced to have any procedures done. What this means is that I believe interventional therapies to be essential part of the diagnosis and proper management of chronic pain conditions. Therefore, patients not interested in these interventional alternatives will be better served under the care of a different practitioner.  The patient was also made aware of my Comprehensive Pain Management Safety Guidelines where by joining this practice, they limit all of their nerve blocks and joint injections to those done by our practice, for as long as we are retained to manage their care. Historic Controlled Substance Pharmacotherapy Review  PMP and historical list of controlled substances: Lorazepam 1 mg, clonazepam 0.5 mg, hydrocodone/acetaminophen 5/325 mg, promethazine with codeine syrup, clonazepam 1 mg, Lyrica 75 mg Highest opioid analgesic regimen found: Hydrocodone/acetaminophen 10/325 mg 6 times  daily (hydrocodone 60 mg) (fill day 12/26/2013) Most recent opioid analgesic: Hydrocodone/acetaminophen 5/325 mg 6 times daily (hydrocodone 30 mg) (fill date for word 24th 2016) Current opioid analgesics: None Highest recorded MME/day: 60 mg/day MME/day: 0 mg/day Medications: The patient did not bring the medication(s) to the appointment, as requested in our "New Patient Package" Pharmacodynamics: Desired effects: Analgesia: The patient reports >50% benefit. Reported improvement in function: The patient reports medication allows him to accomplish basic ADLs. Clinically meaningful improvement in function (CMIF): Sustained CMIF goals met Perceived effectiveness: Described as relatively effective, allowing for increase in activities of daily living (ADL) Undesirable effects: Side-effects or Adverse reactions: None reported Historical Monitoring: The patient  has no drug history on file. List of all UDS Test(s): No results found for: MDMA, COCAINSCRNUR, PCPSCRNUR, PCPQUANT, CANNABQUANT, THCU, [place] List of all Serum Drug Screening Test(s):  No results found for: AMPHSCRSER, BARBSCRSER, BENZOSCRSER, COCAINSCRSER, PCPSCRSER, PCPQUANT, THCSCRSER, CANNABQUANT, OPIATESCRSER, OXYSCRSER, PROPOXSCRSER Historical Background Evaluation: [place] PDMP: Six (6) year initial data search conducted.             [place] Department of public safety, offender search: Editor, commissioning Information) Non-contributory Risk Assessment Profile: Aberrant behavior: None observed or detected today Risk factors for fatal opioid overdose: age 76-47 years old, Benzodiazepine use, liver disease and male gender Fatal overdose hazard ratio (HR): 1.92 for 50-99 MME/day  Non-fatal overdose hazard ratio (HR): 1.44  for 20-49 MME/day Risk of opioid abuse or dependence: 0.7-3.0% with doses ? 36 MME/day and 6.1-26% with doses ? 120 MME/day. Substance use disorder (SUD) risk level: Pending results of Medical Psychology Evaluation for SUD Opioid risk  tool (ORT) (Total Score): 3  ORT Scoring interpretation table:  Score <3 = Low Risk for SUD  Score between 4-7 = Moderate Risk for SUD  Score >8 = High Risk for Opioid Abuse   PHQ-2 Depression Scale:  Total score: 0  PHQ-2 Scoring interpretation table: (Score and probability of major depressive disorder)  Score 0 = No depression  Score 1 = 15.4% Probability  Score 2 = 21.1% Probability  Score 3 = 38.4% Probability  Score 4 = 45.5% Probability  Score 5 = 56.4% Probability  Score 6 = 78.6% Probability   PHQ-9 Depression Scale:  Total score: 0  PHQ-9 Scoring interpretation table:  Score 0-4 = No depression  Score 5-9 = Mild depression  Score 10-14 = Moderate depression  Score 15-19 = Moderately severe depression  Score 20-27 = Severe depression (2.4 times higher risk of SUD and 2.89 times higher risk of overuse)   Pharmacologic Plan: Pending ordered tests and/or consults  Meds  The patient has a current medication list which includes the following prescription(s): amlodipine, lorazepam, and paroxetine.  No current outpatient prescriptions on file prior to visit.   No current facility-administered medications on file prior to visit.    Imaging Review   Note: No new results found. Medical records to be obtained      ROS  Cardiovascular History: High blood pressure Pulmonary or Respiratory History: Snoring  Neurological History: No reported neurological signs or symptoms such as seizures, abnormal skin sensations, urinary and/or fecal incontinence, being born with an abnormal open spine and/or a tethered spinal cord Review of Past Neurological Studies:  Results for orders placed or performed during the hospital encounter of 03/21/08  CT Head Wo Contrast   Narrative   Clinical Data:  Headache and numbness.  Hypertension.   CT HEAD WITHOUT CONTRAST   Technique: Contiguous axial images were obtained from the base of the skull through the vertex without contrast    Comparison: None   Findings:  There is no evidence of intracranial hemorrhage, brain edema, or other signs of acute infarction.  There is no evidence of intracranial mass lesions, or mass effect.  No abnormal extraaxial fluid collections are identified.  There is no evidence of hydrocephalus, or other significant intracranial abnormality.  No skull abnormality identified.   IMPRESSION: Negative non-contrast head CT.  Provider: Guadlupe Spanish   Psychological-Psychiatric History: Anxiousness and Prone to panicking Gastrointestinal History: Reflux or heatburn Genitourinary History: Passing kidney stones Hematological History: No reported hematological signs or symptoms such as prolonged bleeding, low or poor functioning platelets, bruising or bleeding easily, hereditary bleeding problems, low energy levels due to low hemoglobin or being anemic Endocrine History: No reported endocrine signs or symptoms such as high or low blood sugar, rapid heart rate due to high thyroid levels, obesity or weight gain due to slow thyroid or thyroid disease Rheumatologic History: No reported rheumatological signs and symptoms such as fatigue, joint pain, tenderness, swelling, redness, heat, stiffness, decreased range of motion, with or without associated rash Musculoskeletal History: Negative for myasthenia gravis, muscular dystrophy, multiple sclerosis or malignant hyperthermia Work History: Working full time  Allergies  Mr. Pellow has no allergies on file.  Laboratory Chemistry  Inflammation Markers Lab Results  Component Value Date   CRP  2.5 11/23/2016   ESRSEDRATE 7 11/23/2016   (CRP: Acute Phase) (ESR: Chronic Phase) Renal Function Markers Lab Results  Component Value Date   BUN 11 11/23/2016   CREATININE 0.85 11/23/2016   GFRAA 120 11/23/2016   GFRNONAA 104 11/23/2016   Hepatic Function Markers Lab Results  Component Value Date   AST 52 (H) 11/23/2016   ALT 67 (H) 11/23/2016   ALBUMIN  4.4 11/23/2016   ALKPHOS 88 11/23/2016   Electrolytes Lab Results  Component Value Date   NA 142 11/23/2016   K 4.0 11/23/2016   CL 101 11/23/2016   CALCIUM 9.9 11/23/2016   MG 2.2 11/23/2016   Neuropathy Markers Lab Results  Component Value Date   VITAMINB12 1,551 (H) 11/23/2016   Bone Pathology Markers Lab Results  Component Value Date   ALKPHOS 88 11/23/2016   25OHVITD1 22 (L) 11/23/2016   25OHVITD2 <1.0 11/23/2016   25OHVITD3 22 11/23/2016   CALCIUM 9.9 11/23/2016   Coagulation Parameters Lab Results  Component Value Date   PLT 269 03/21/2008   Cardiovascular Markers Lab Results  Component Value Date   HGB 16.0 03/21/2008   HCT 47.0 03/21/2008   Note: Lab results reviewed.  [place]  Drug: Mr. Belshe  has no drug history on file. Alcohol:  has no alcohol history on file. Tobacco:  reports that he has never smoked. He has never used smokeless tobacco. Medical:  has a past medical history of Angina at rest [facility]) and Hypertension. Family: family history is not on file.  Past Surgical History:  Procedure Laterality Date  . NASAL SINUS SURGERY  2006   Active Ambulatory Problems    Diagnosis Date Noted  . Opiate use 11/23/2016  . Long term current use of opiate analgesic 11/23/2016  . Long term prescription benzodiazepine use 11/23/2016  . Chronic pain syndrome 11/23/2016  . Chronic neck pain (Secondary Area of pain) (Bilateral) (L>R) 11/25/2016  . Chronic head pain (Primary Area of Pain) (Left) 11/25/2016  . Chronic shoulder pain [facility] Area of pain) (Bilateral) (L>R) 11/25/2016  . Cervicogenic headache 12/28/2016  . Occipital headache 12/28/2016  . Radicular pain of shoulder 12/28/2016   Resolved Ambulatory Problems    Diagnosis Date Noted  . No Resolved Ambulatory Problems   Past Medical History:  Diagnosis Date  . Angina at rest [facility])   . Hypertension    Constitutional Exam  General appearance: Well nourished, well developed, and well hydrated.  In no apparent acute distress Vitals:   11/23/16 1312  BP: 138/83  Pulse: 86  Resp: 16  Temp: 98.1 F (36.7 C)  SpO2: 98%  Weight: 192 lb (87.1 kg)  Height: '5\' 8"'$  (1.727 m)   BMI Assessment: Estimated body mass index is 29.19 kg/m as calculated from the following:   Height as of this encounter: '5\' 8"'$  (1.727 m).   Weight as of this encounter: 192 lb (87.1 kg).  BMI interpretation table: BMI level Category Range association with higher incidence of chronic pain  <18 kg/m2 Underweight   18.5-24.9 kg/m2 Ideal body weight   25-29.9 kg/m2 Overweight Increased incidence by 20%  30-34.9 kg/m2 Obese (Class I) Increased incidence by 68%  35-39.9 kg/m2 Severe obesity (Class II) Increased incidence by 136%  >40 kg/m2 Extreme obesity (Class III) Increased incidence by 254%   BMI Readings from Last 4 Encounters:  11/23/16 29.19 kg/m   Wt Readings from Last 4 Encounters:  11/23/16 192 lb (87.1 kg)  Psych/Mental status: Alert, oriented x 3 (person, place, &  time)       Eyes: PERLA Respiratory: No evidence of acute respiratory distress  Cervical Spine Exam  Inspection: Well healed scar from previous spine surgery detected Alignment: Symmetrical Functional ROM: Unrestricted ROM      Stability: No instability detected Muscle strength & Tone: Functionally intact Sensory: Unimpaired Palpation: Complains of area being tender to palpation              Upper Extremity (UE) Exam    Side: Right upper extremity  Side: Left upper extremity  Inspection: No masses, redness, swelling, or asymmetry. No contractures  Inspection: No masses, redness, swelling, or asymmetry. No contractures  Functional ROM: Unrestricted ROM          Functional ROM: Unrestricted ROM          Muscle strength & Tone: Functionally intact  Muscle strength & Tone: Functionally intact  Sensory: Unimpaired  Sensory: Unimpaired  Palpation: No palpable anomalies              Palpation: No palpable anomalies               Specialized Test(s): Deferred         Specialized Test(s): Deferred          Thoracic Spine Exam  Inspection: No masses, redness, or swelling Alignment: Symmetrical Functional ROM: Unrestricted ROM Stability: No instability detected Sensory: Unimpaired Muscle strength & Tone: No palpable anomalies  Lumbar Spine Exam  Inspection: No masses, redness, or swelling Alignment: Symmetrical Functional ROM: Unrestricted ROM      Stability: No instability detected Muscle strength & Tone: Functionally intact Sensory: Unimpaired Palpation: No palpable anomalies       Provocative Tests: Lumbar Hyperextension and rotation test: evaluation deferred today       Patrick's Maneuver: evaluation deferred today                    Gait & Posture Assessment  Ambulation: Unassisted Gait: Relatively normal for age and body habitus Posture: WNL   Lower Extremity Exam    Side: Right lower extremity  Side: Left lower extremity  Inspection: No masses, redness, swelling, or asymmetry. No contractures  Inspection: No masses, redness, swelling, or asymmetry. No contractures  Functional ROM: Unrestricted ROM          Functional ROM: Unrestricted ROM          Muscle strength & Tone: Functionally intact  Muscle strength & Tone: Functionally intact  Sensory: Unimpaired  Sensory: Unimpaired  Palpation: No palpable anomalies  Palpation: No palpable anomalies   Assessment  Primary Diagnosis & Pertinent Problem List: The primary encounter diagnosis was Chronic head pain. Diagnoses of Chronic neck pain, Chronic pain syndrome, and Opiate use were also pertinent to this visit.  Visit Diagnosis: 1. Chronic head pain   2. Chronic neck pain   3. Chronic pain syndrome   4. Opiate use    Plan of Care  Initial treatment plan:  Please be advised that as per protocol, today's visit has been an evaluation only. We have not taken over the patient's controlled substance management.  Problem-specific plan: No  problem-specific Assessment & Plan notes found for this encounter.  Ordered Lab-work, Procedure(s), Referral(s), & Consult(s): Orders Placed This Encounter  Procedures  . Compliance Drug Analysis, Ur  . Comprehensive metabolic panel  . C-reactive protein  . Sedimentation rate  . Magnesium  . 25-Hydroxyvitamin D Lcms D2+D3  . Vitamin B12   Pharmacotherapy: Medications ordered:  No orders of  the defined types were placed in this encounter.  Medications administered during this visit: Mr. Covelli had no medications administered during this visit.   Pharmacotherapy under consideration:  Opioid Analgesics: The patient was informed that there is no guarantee that he would be a candidate for opioid analgesics. The decision will be made following CDC guidelines. This decision will be based on the results of diagnostic studies, as well as Mr. Micheals risk profile.  Membrane stabilizer: To be determined at a later time Muscle relaxant: To be determined at a later time NSAID: To be determined at a later time Other analgesic(s): To be determined at a later time   Interventional therapies under consideration: Mr. President was informed that there is no guarantee that he would be a candidate for interventional therapies. The decision will be based on the results of diagnostic studies, as well as Mr. Lucchese risk profile.  Possible procedure(s): Diagnostic Occipital nerve block  Diagnostic left cervical epidural steroid injection Diagnostic Left sided cervical facet block Possible Left-sided cervical radiofrequency ablation    Provider-requested follow-up: Return in about 3 weeks (around 12/14/2016) for w/ Dr. Dossie Arbour.  Future Appointments Date Time Provider [place]  12/29/2016 8:00 AM Milinda Pointer, MD [facility] None    Primary Care Physician: Maurice Small, MD Location: [facility] Outpatient Pain Management Facility Note by:  Date: 11/23/2016; Time: 4:08 PM  Pain Score Disclaimer: We use  the NRS-11 scale. This is a self-reported, subjective measurement of pain severity with only modest accuracy. It is used primarily to identify changes within a particular patient. It must be understood that outpatient pain scales are significantly less accurate that those used for research, where they can be applied under ideal controlled circumstances with minimal exposure to variables. In reality, the score is likely to be a combination of pain intensity and pain affect, where pain affect describes the degree of emotional arousal or changes in action readiness caused by the sensory experience of pain. Factors such as social and work situation, setting, emotional state, anxiety levels, expectation, and prior pain experience may influence pain perception and show large inter-individual differences that may also be affected by time variables.  Patient instructions provided during this appointment: Patient Instructions    ____________________________________________________________________________________________  Appointment Policy Summary  It is our goal and responsibility to provide the medical community with assistance in the evaluation and management of patients with chronic pain. Unfortunately our resources are limited. Because we do not have an unlimited amount of time, or available appointments, we are required to closely monitor and manage their use. The following rules exist to maximize their use:  Patient's responsibilities: 1. Punctuality:  At what time should I arrive? You should be physically present in our office 30 minutes before your scheduled appointment. Your scheduled appointment is with your assigned healthcare provider. However, it takes 5-10 minutes to be "checked-in", and another 15 minutes for the nurses to do the admission. If you arrive to our office at the time you were given for your appointment, you will end up being at least 20-25 minutes late to your appointment with the  provider. 2. Tardiness:  What happens if I arrive only a few minutes after my scheduled appointment time? You will need to reschedule your appointment. The cutoff is your appointment time. This is why it is so important that you arrive at least 30 minutes before that appointment. If you have an appointment scheduled for 10:00 AM and you arrive at 10:01, you will be required to reschedule  your appointment.  3. Plan ahead:  Always assume that you will encounter traffic on your way in. Plan for it. If you are dependent on a driver, make sure they understand these rules and the need to arrive early. 4. Other appointments and responsibilities:  Avoid scheduling any other appointments before or after your pain clinic appointments.  5. Be prepared:  Write down everything that you need to discuss with your healthcare provider and give this information to the admitting nurse. Write down the medications that you will need refilled. Bring your pills and bottles (even the empty ones), to all of your appointments, except for those where a procedure is scheduled. 6. No children or pets:  Find someone to take care of them. It is not appropriate to bring them in. 7. Scheduling changes:  We request "advanced notification" of any changes or cancellations. 8. Advanced notification:  Defined as a time period of more than 24 hours prior to the originally scheduled appointment. This allows for the appointment to be offered to other patients. 9. Rescheduling:  When a visit is rescheduled, it will require the cancellation of the original appointment. For this reason they both fall within the category of "Cancellations".  10. Cancellations:  They require advanced notification. Any cancellation less than 24 hours before the  appointment will be recorded as a "No Show". 11. No Show:  Defined as an unkept appointment where the patient failed to notify or declare to the practice their intention or inability to keep the  appointment.  Corrective process for repeat offenders:  1. Tardiness: Three (3) episodes of rescheduling due to late arrivals will be recorded as one (1) "No Show". 2. Cancellation or reschedule: Three (3) cancellations or rescheduling will be recorded as one (1) "No Show". 3. "No Shows": Three (3) "No Shows" within a 12 month period will result in discharge from the practice.  ____________________________________________________________________________________________  ____________________________________________________________________________________________  Pain Scale  Introduction: The pain score used by this practice is the Verbal Numerical Rating Scale (VNRS-11). This is an 11-point scale. It is for adults and children 10 years or older. There are significant differences in how the pain score is reported, used, and applied. Forget everything you learned in the past and learn this scoring system.  General Information: The scale should reflect your current level of pain. Unless you are specifically asked for the level of your worst pain, or your average pain. If you are asked for one of these two, then it should be understood that it is over the past 24 hours.  Basic Activities of Daily Living (ADL): Personal hygiene, dressing, eating, transferring, and using restroom.  Instructions: Most patients tend to report their level of pain as a combination of two factors, their physical pain and their psychosocial pain. This last one is also known as "suffering" and it is reflection of how physical pain affects you socially and psychologically. From now on, report them separately. From this point on, when asked to report your pain level, report only your physical pain. Use the following table for reference.  Pain Clinic Pain Levels (0-5/10)  Pain Level Score  Description  No Pain 0   Mild pain 1 Nagging, annoying, but does not interfere with basic activities of daily living (ADL). Patients are  able to eat, bathe, get dressed, toileting (being able to get on and off the toilet and perform personal hygiene functions), transfer (move in and out of bed or a chair without assistance), and maintain continence (able to  control bladder and bowel functions). Blood pressure and heart rate are unaffected. A normal heart rate for a healthy adult ranges from 60 to 100 bpm (beats per minute).   Mild to moderate pain 2 Noticeable and distracting. Impossible to hide from other people. More frequent flare-ups. Still possible to adapt and function close to normal. It can be very annoying and may have occasional stronger flare-ups. With discipline, patients may get used to it and adapt.   Moderate pain 3 Interferes significantly with activities of daily living (ADL). It becomes difficult to feed, bathe, get dressed, get on and off the toilet or to perform personal hygiene functions. Difficult to get in and out of bed or a chair without assistance. Very distracting. With effort, it can be ignored when deeply involved in activities.   Moderately severe pain 4 Impossible to ignore for more than a few minutes. With effort, patients may still be able to manage work or participate in some social activities. Very difficult to concentrate. Signs of autonomic nervous system discharge are evident: dilated pupils (mydriasis); mild sweating (diaphoresis); sleep interference. Heart rate becomes elevated (>115 bpm). Diastolic blood pressure (lower number) rises above 100 mmHg. Patients find relief in laying down and not moving.   Severe pain 5 Intense and extremely unpleasant. Associated with frowning face and frequent crying. Pain overwhelms the senses.  Ability to do any activity or maintain social relationships becomes significantly limited. Conversation becomes difficult. Pacing back and forth is common, as getting into a comfortable position is nearly impossible. Pain wakes you up from deep sleep. Physical signs will be  obvious: pupillary dilation; increased sweating; goosebumps; brisk reflexes; cold, clammy hands and feet; nausea, vomiting or dry heaves; loss of appetite; significant sleep disturbance with inability to fall asleep or to remain asleep. When persistent, significant weight loss is observed due to the complete loss of appetite and sleep deprivation.  Blood pressure and heart rate becomes significantly elevated. Caution: If elevated blood pressure triggers a pounding headache associated with blurred vision, then the patient should immediately seek attention at an urgent or emergency care unit, as these may be signs of an impending stroke.    Emergency Department Pain Levels (6-10/10)  Emergency Room Pain 6 Severely limiting. Requires emergency care and should not be seen or managed at an outpatient pain management facility. Communication becomes difficult and requires great effort. Assistance to reach the emergency department may be required. Facial flushing and profuse sweating along with potentially dangerous increases in heart rate and blood pressure will be evident.   Distressing pain 7 Self-care is very difficult. Assistance is required to transport, or use restroom. Assistance to reach the emergency department will be required. Tasks requiring coordination, such as bathing and getting dressed become very difficult.   Disabling pain 8 Self-care is no longer possible. At this level, pain is disabling. The individual is unable to do even the most "basic" activities such as walking, eating, bathing, dressing, transferring to a bed, or toileting. Fine motor skills are lost. It is difficult to think clearly.   Incapacitating pain 9 Pain becomes incapacitating. Thought processing is no longer possible. Difficult to remember your own name. Control of movement and coordination are lost.   The worst pain imaginable 10 At this level, most patients pass out from pain. When this level is reached, collapse of the  autonomic nervous system occurs, leading to a sudden drop in blood pressure and heart rate. This in turn results in a temporary and dramatic  drop in blood flow to the brain, leading to a loss of consciousness. Fainting is one of the body's self defense mechanisms. Passing out puts the brain in a calmed state and causes it to shut down for a while, in order to begin the healing process.    Summary: 1. Refer to this scale when providing [place] with your pain level. 2. Be accurate and careful when reporting your pain level. This will help with your care. 3. Over-reporting your pain level will lead to loss of credibility. 4. Even a level of 1/10 means that there is pain and will be treated at our facility. 5. High, inaccurate reporting will be documented as "Symptom Exaggeration", leading to loss of credibility and suspicions of possible secondary gains such as obtaining more narcotics, or wanting to appear disabled, for fraudulent reasons. 6. Only pain levels of 5 or below will be seen at our facility. 7. Pain levels of 6 and above will be sent to the Emergency Department and the appointment cancelled. ____________________________________________________________________________________________

## 2016-11-23 NOTE — Patient Instructions (Signed)

## 2016-11-23 NOTE — Progress Notes (Signed)
Safety precautions to be maintained throughout the outpatient stay will include: orient to surroundings, keep bed in low position, maintain call bell within reach at all times, provide assistance with transfer out of bed and ambulation.  

## 2016-11-25 DIAGNOSIS — G8929 Other chronic pain: Secondary | ICD-10-CM | POA: Insufficient documentation

## 2016-11-25 DIAGNOSIS — R51 Headache: Secondary | ICD-10-CM

## 2016-11-25 DIAGNOSIS — M542 Cervicalgia: Secondary | ICD-10-CM

## 2016-11-25 DIAGNOSIS — M25512 Pain in left shoulder: Secondary | ICD-10-CM

## 2016-11-25 DIAGNOSIS — M25511 Pain in right shoulder: Secondary | ICD-10-CM

## 2016-11-26 ENCOUNTER — Ambulatory Visit
Admission: RE | Admit: 2016-11-26 | Discharge: 2016-11-26 | Disposition: A | Discharge status: Home / Self Care | Payer: BLUE CROSS/BLUE SHIELD | Source: Ambulatory Visit | Attending: Gastroenterology | Admitting: Gastroenterology

## 2016-11-26 DIAGNOSIS — K8020 Calculus of gallbladder without cholecystitis without obstruction: Secondary | ICD-10-CM | POA: Diagnosis not present

## 2016-11-26 DIAGNOSIS — R079 Chest pain, unspecified: Secondary | ICD-10-CM

## 2016-11-29 LAB — COMPREHENSIVE METABOLIC PANEL
A/G RATIO: 1.6 (ref 1.2–2.2)
ALT: 67 IU/L — ABNORMAL HIGH (ref 0–44)
AST: 52 IU/L — AB (ref 0–40)
Albumin: 4.4 g/dL (ref 3.5–5.5)
Alkaline Phosphatase: 88 IU/L (ref 39–117)
BILIRUBIN TOTAL: 0.2 mg/dL (ref 0.0–1.2)
BUN/Creatinine Ratio: 13 (ref 9–20)
BUN: 11 mg/dL (ref 6–24)
CALCIUM: 9.9 mg/dL (ref 8.7–10.2)
CHLORIDE: 101 mmol/L (ref 96–106)
CO2: 20 mmol/L (ref 20–29)
Creatinine, Ser: 0.85 mg/dL (ref 0.76–1.27)
GFR calc Af Amer: 120 mL/min/{1.73_m2} (ref >59)
GFR, EST NON AFRICAN AMERICAN: 104 mL/min/{1.73_m2} (ref >59)
GLOBULIN, TOTAL: 2.7 g/dL (ref 1.5–4.5)
Glucose: 224 mg/dL — ABNORMAL HIGH (ref 65–99)
POTASSIUM: 4 mmol/L (ref 3.5–5.2)
SODIUM: 142 mmol/L (ref 134–144)
Total Protein: 7.1 g/dL (ref 6.0–8.5)

## 2016-11-29 LAB — 25-HYDROXY VITAMIN D LCMS D2+D3
25-Hydroxy, Vitamin D-2: <1 ng/mL
25-Hydroxy, Vitamin D: 22 ng/mL — ABNORMAL LOW

## 2016-11-29 LAB — SEDIMENTATION RATE: SED RATE: 7 mm/h (ref 0–15)

## 2016-11-29 LAB — C-REACTIVE PROTEIN: CRP: 2.5 mg/L (ref 0.0–4.9)

## 2016-11-29 LAB — 25-HYDROXYVITAMIN D LCMS D2+D3: 25-HYDROXY, VITAMIN D-3: 22 ng/mL

## 2016-11-29 LAB — VITAMIN B12: VITAMIN B 12: 1551 pg/mL — AB (ref 232–1245)

## 2016-11-29 LAB — MAGNESIUM: Magnesium: 2.2 mg/dL (ref 1.6–2.3)

## 2016-11-30 LAB — COMPLIANCE DRUG ANALYSIS, UR

## 2016-12-06 DIAGNOSIS — R739 Hyperglycemia, unspecified: Secondary | ICD-10-CM | POA: Diagnosis not present

## 2016-12-06 DIAGNOSIS — I10 Essential (primary) hypertension: Secondary | ICD-10-CM | POA: Diagnosis not present

## 2016-12-06 DIAGNOSIS — Z125 Encounter for screening for malignant neoplasm of prostate: Secondary | ICD-10-CM | POA: Diagnosis not present

## 2016-12-06 DIAGNOSIS — Z0000 Encounter for general adult medical examination without abnormal findings: Secondary | ICD-10-CM | POA: Diagnosis not present

## 2016-12-06 DIAGNOSIS — E785 Hyperlipidemia, unspecified: Secondary | ICD-10-CM | POA: Diagnosis not present

## 2016-12-13 ENCOUNTER — Ambulatory Visit: Payer: BLUE CROSS/BLUE SHIELD | Admitting: Pain Medicine

## 2016-12-13 NOTE — Progress Notes (Deleted)
Patient's Name: Maxwell Richards  MRN: [id number]  Referring Provider: Maurice Small, MD  DOB: 10-17-1970  PCP: Maurice Small, MD  DOS: 12/13/2016  Note by: Gaspar Cola, MD  Service setting: Ambulatory outpatient  Specialty: Interventional Pain Management  Location: [facility] (AMB) Pain Management Facility    Patient type: Established   Primary Reason(s) for Visit: Encounter for evaluation before starting new chronic pain management plan of care (Level of risk: moderate) CC: No chief complaint on file.  HPI  Maxwell Richards is a 46 y.o. year old, male patient, who comes today for a follow-up evaluation to review the test results and decide on a treatment plan. He has Opiate use; Long term current use of opiate analgesic; Long term prescription benzodiazepine use; Chronic pain syndrome; Chronic neck pain (Secondary Area of pain) (Bilateral) (L>R); Chronic head pain (Primary Area of Pain) (Left); and Chronic shoulder pain (Tertiary Area of pain) (Bilateral) (L>R) on his problem list. His primarily concern today is the No chief complaint on file.  Pain Assessment: Location:     Radiating:   Onset:   Duration:   Quality:   Severity:  /10 (self-reported pain score)  Note: Reported level is compatible with observation.                   Effect on ADL:   Timing:   Modifying factors:    Maxwell Richards comes in today for a follow-up visit after his initial evaluation on 11/23/2016. Today we went over the results of his tests. These were explained in "Layman's terms". During today's appointment we went over my diagnostic impression, as well as the proposed treatment plan.  According to the patient's primary area of pain is in the top of his head. He admitted that at the age of 46 he admits that he suffered a blow to this area while getting up from the lower level of a bunk bed. He admits that he tolerated the pain for several years and was seen by many eye doctors for pain behind his eyes. He admits that he did have a  surgical procedure by plastic surgeon to look at the occipital nerve . He admits that it was initially going to be cut the nerve to help relieve his pain. He admits that he has numbness with radicular pain that goes around his face behind his eye. The pain also radiates down into his neck with tightness in the shoulders. He admits that he has ringing in ear. The left side is greater than the right. He denies any interventional procedures. He has had Chinese coin scraping which made the pain worse. He currently uses an activator method technique used by chiropractors. He states this is effective for a while but then returns.  His second area of pain is his neck. He admits the left is greater than the right. He states that he does radiate down into her shoulders. He denies any previous procedures, physical therapy but has had recent images.  Third area of pain is in his shoulders with left being greater than the right. He denies any previous surgeries, physical therapy. He admits that he has had x-rays of his shoulders.  In considering the treatment plan options, Maxwell Richards was reminded that I no longer take patients for medication management only. I asked him to let me know if he had no intention of taking advantage of the interventional therapies, so that we could make arrangements to provide this space to someone interested. I  also made it clear that undergoing interventional therapies for the purpose of getting pain medications is very inappropriate on the part of a patient, and it will not be tolerated in this practice. This type of behavior would suggest true addiction and therefore it requires referral to an addiction specialist.   Further details on both, my assessment(s), as well as the proposed treatment plan, please see below.  Controlled Substance Pharmacotherapy Assessment REMS (Risk Evaluation and Mitigation Strategy)  Analgesic: None Highest recorded MME/day: 60 mg/day MME/day: 0  mg/day Pill Count: None expected due to no prior prescriptions written by our practice. No notes on file Pharmacokinetics: Liberation and absorption (onset of action): WNL Distribution (time to peak effect): WNL Metabolism and excretion (duration of action): WNL         Pharmacodynamics: Desired effects: Analgesia: Maxwell Richards reports >50% benefit. Functional ability: Patient reports that medication allows him to accomplish basic ADLs Clinically meaningful improvement in function (CMIF): Sustained CMIF goals met Perceived effectiveness: Described as relatively effective, allowing for increase in activities of daily living (ADL) Undesirable effects: Side-effects or Adverse reactions: None reported Monitoring:  PMP: Online review of the past 71-monthperiod previously conducted. Not applicable at this point since we have not taken over the patient's medication management yet. List of all Serum Drug Screening Test(s):  No results found for: AMPHSCRSER, BARBSCRSER, BENZOSCRSER, COCAINSCRSER, PCPSCRSER, THCSCRSER, OPIATESCRSER, [place] of all UDS test(s) done:  Lab Results  Component Value Date   SUMMARY FINAL 11/23/2016   Last UDS on record: Summary  Date Value Ref Range Status  11/23/2016 FINAL  Final    Comment:    ==================================================================== TOXASSURE COMP DRUG ANALYSIS,UR ==================================================================== Test                             Result       Flag       Units Drug Present   Lorazepam                      183                     ng/mg creat    Source of lorazepam is a scheduled prescription medication.   Diphenhydramine                PRESENT ==================================================================== Test                      Result    Flag   Units      Ref Range   Creatinine              280              mg/dL       >=20 ==================================================================== Declared Medications:  Medication list was not provided. ==================================================================== For clinical consultation, please call [phone] ====================================================================    UDS interpretation: No unexpected findings.          Medication Assessment Form: Patient introduced to form today Treatment compliance: Treatment may start today if patient agrees with proposed plan. Evaluation of compliance is not applicable at this point Risk Assessment Profile: Aberrant behavior: See initial evaluations. None observed or detected today Comorbid factors increasing risk of overdose: See initial evaluation. No additional risks detected today Medical Psychology Evaluation: Please see scanned results in medical record. Risk Mitigation Strategies:  Patient opioid safety counseling: Completed today. Counseling  provided to patient as per "Patient Counseling Document". Document signed by patient, attesting to counseling and understanding Patient-Prescriber Agreement (PPA): Obtained today.  Controlled substance notification to other providers: Written and sent today.  Pharmacologic Plan: Today we may be taking over the patient's pharmacological regimen. See below             Laboratory Chemistry  Inflammation Markers (CRP: Acute Phase) (ESR: Chronic Phase) Lab Results  Component Value Date   CRP 2.5 11/23/2016   ESRSEDRATE 7 11/23/2016                 Renal Function Markers Lab Results  Component Value Date   BUN 11 11/23/2016   CREATININE 0.85 11/23/2016   GFRAA 120 11/23/2016   GFRNONAA 104 11/23/2016                 Hepatic Function Markers Lab Results  Component Value Date   AST 52 (H) 11/23/2016   ALT 67 (H) 11/23/2016   ALBUMIN 4.4 11/23/2016   ALKPHOS 88 11/23/2016                 Electrolytes Lab Results  Component Value Date    NA 142 11/23/2016   K 4.0 11/23/2016   CL 101 11/23/2016   CALCIUM 9.9 11/23/2016   MG 2.2 11/23/2016                 Neuropathy Markers Lab Results  Component Value Date   VITAMINB12 1,551 (H) 11/23/2016                 Bone Pathology Markers Lab Results  Component Value Date   ALKPHOS 88 11/23/2016   25OHVITD1 22 (L) 11/23/2016   25OHVITD2 <1.0 11/23/2016   25OHVITD3 22 11/23/2016   CALCIUM 9.9 11/23/2016                 Coagulation Parameters Lab Results  Component Value Date   PLT 269 03/21/2008                 Cardiovascular Markers Lab Results  Component Value Date   HGB 16.0 03/21/2008   HCT 47.0 03/21/2008                 Note: Lab results reviewed.  Recent Diagnostic Imaging Review  US Abdomen Complete Result Date: 11/26/2016 CLINICAL DATA:  Epigastric pain EXAM: ABDOMEN ULTRASOUND COMPLETE COMPARISON:  12/03/2015 FINDINGS: Gallbladder: Well distended. A small 5 mm focus is noted without significant shadowing. This lies within the region of the gallbladder neck and corresponds to a calcifications seen on prior CT examination consistent small gallstone. No complicating factors are seen. Common bile duct: Diameter: 4.2 mm. Liver: Mild increased echogenicity is noted consistent with fatty infiltration. A 1 cm cyst is noted inferiorly with within the right lobe of the liver. This is also stable from the prior CT. IVC: No abnormality visualized. Pancreas: Visualized portion unremarkable. Spleen: Size and appearance within normal limits. Right Kidney: Length: 12.9 cm. Echogenicity within normal limits. No mass or hydronephrosis visualized. A 1.9 cm cyst is noted. Some increased echogenicity is noted within the wall consistent calcification. Left Kidney: Length: 12.4 cm. Echogenicity within normal limits. No mass or hydronephrosis visualized. Abdominal aorta: No aneurysm visualized. Other findings: None. IMPRESSION: Small gallstone without complicating factors. Hepatic  and renal cysts. Electronically Signed   By: Inez Catalina M.D.   On: 11/26/2016 09:14   Note: Results of ordered imaging test(s) reviewed and explained to  patient in Layman's terms. Copy of results provided to patient  Meds   No outpatient prescriptions have been marked as taking for the 12/13/16 encounter (Appointment) with Milinda Pointer, MD.    ROS  Constitutional: Denies any fever or chills Gastrointestinal: No reported hemesis, hematochezia, vomiting, or acute GI distress Musculoskeletal: Denies any acute onset joint swelling, redness, loss of ROM, or weakness Neurological: No reported episodes of acute onset apraxia, aphasia, dysarthria, agnosia, amnesia, paralysis, loss of coordination, or loss of consciousness  Allergies  Mr. Jaworski has no allergies on file.  [place]  Drug: Mr. Symmonds  has no drug history on file. Alcohol:  has no alcohol history on file. Tobacco:  reports that he has never smoked. He has never used smokeless tobacco. Medical:  has a past medical history of Angina at rest [facility]) and Hypertension. Surgical: Mr. Magnussen  has a past surgical history that includes Nasal sinus surgery (2006). Family: family history is not on file.  Constitutional Exam  General appearance: Well nourished, well developed, and well hydrated. In no apparent acute distress There were no vitals filed for this visit. BMI Assessment: Estimated body mass index is 29.19 kg/m as calculated from the following:   Height as of 11/23/16: _0  (1.727 m).   Weight as of 11/23/16: 192 lb (87.1 kg).  BMI interpretation table: BMI level Category Range association with higher incidence of chronic pain  <18 kg/m2 Underweight   18.5-24.9 kg/m2 Ideal body weight   25-29.9 kg/m2 Overweight Increased incidence by 20%  30-34.9 kg/m2 Obese (Class I) Increased incidence by 68%  35-39.9 kg/m2 Severe obesity (Class II) Increased incidence by 136%  >40 kg/m2 Extreme obesity (Class III) Increased incidence by 254%    BMI Readings from Last 4 Encounters:  11/23/16 29.19 kg/m   Wt Readings from Last 4 Encounters:  11/23/16 192 lb (87.1 kg)  Psych/Mental status: Alert, oriented x 3 (person, place, & time)       Eyes: PERLA Respiratory: No evidence of acute respiratory distress  Cervical Spine Area Exam  Skin & Axial Inspection: No masses, redness, edema, swelling, or associated skin lesions Alignment: Symmetrical Functional ROM: Unrestricted ROM      Stability: No instability detected Muscle Tone/Strength: Functionally intact. No obvious neuro-muscular anomalies detected. Sensory (Neurological): Unimpaired Palpation: No palpable anomalies              Upper Extremity (UE) Exam    Side: Right upper extremity  Side: Left upper extremity  Skin & Extremity Inspection: Skin color, temperature, and hair growth are WNL. No peripheral edema or cyanosis. No masses, redness, swelling, asymmetry, or associated skin lesions. No contractures.  Skin & Extremity Inspection: Skin color, temperature, and hair growth are WNL. No peripheral edema or cyanosis. No masses, redness, swelling, asymmetry, or associated skin lesions. No contractures.  Functional ROM: Unrestricted ROM          Functional ROM: Unrestricted ROM          Muscle Tone/Strength: Functionally intact. No obvious neuro-muscular anomalies detected.  Muscle Tone/Strength: Functionally intact. No obvious neuro-muscular anomalies detected.  Sensory (Neurological): Unimpaired  Sensory (Neurological): Unimpaired  Palpation: No palpable anomalies              Palpation: No palpable anomalies              Specialized Test(s): Deferred         Specialized Test(s): Deferred          Thoracic Spine Area Exam  Skin & Axial Inspection: No masses, redness, or swelling Alignment: Symmetrical Functional ROM: Unrestricted ROM Stability: No instability detected Muscle Tone/Strength: Functionally intact. No obvious neuro-muscular anomalies detected. Sensory  (Neurological): Unimpaired Muscle strength & Tone: No palpable anomalies  Lumbar Spine Area Exam  Skin & Axial Inspection: No masses, redness, or swelling Alignment: Symmetrical Functional ROM: Unrestricted ROM      Stability: No instability detected Muscle Tone/Strength: Functionally intact. No obvious neuro-muscular anomalies detected. Sensory (Neurological): Unimpaired Palpation: No palpable anomalies       Provocative Tests: Lumbar Hyperextension and rotation test: evaluation deferred today       Lumbar Lateral bending test: evaluation deferred today       Patrick's Maneuver: evaluation deferred today                    Gait & Posture Assessment  Ambulation: Unassisted Gait: Relatively normal for age and body habitus Posture: WNL   Lower Extremity Exam    Side: Right lower extremity  Side: Left lower extremity  Skin & Extremity Inspection: Skin color, temperature, and hair growth are WNL. No peripheral edema or cyanosis. No masses, redness, swelling, asymmetry, or associated skin lesions. No contractures.  Skin & Extremity Inspection: Skin color, temperature, and hair growth are WNL. No peripheral edema or cyanosis. No masses, redness, swelling, asymmetry, or associated skin lesions. No contractures.  Functional ROM: Unrestricted ROM          Functional ROM: Unrestricted ROM          Muscle Tone/Strength: Functionally intact. No obvious neuro-muscular anomalies detected.  Muscle Tone/Strength: Functionally intact. No obvious neuro-muscular anomalies detected.  Sensory (Neurological): Unimpaired  Sensory (Neurological): Unimpaired  Palpation: No palpable anomalies  Palpation: No palpable anomalies   Assessment & Plan  Primary Diagnosis & Pertinent Problem List: The primary encounter diagnosis was Chronic head pain (Primary Area of Pain) (Left). Diagnoses of Chronic neck pain (Secondary Area of pain) (Bilateral) (L>R), Chronic shoulder pain (Tertiary Area of pain) (Bilateral) (L>R),  and Chronic pain syndrome were also pertinent to this visit.  Visit Diagnosis: 1. Chronic head pain (Primary Area of Pain) (Left)   2. Chronic neck pain (Secondary Area of pain) (Bilateral) (L>R)   3. Chronic shoulder pain (Tertiary Area of pain) (Bilateral) (L>R)   4. Chronic pain syndrome    Problems updated and reviewed during this visit: Problem  Chronic neck pain (Secondary Area of pain) (Bilateral) (L>R)  Chronic head pain (Primary Area of Pain) (Left)  Chronic shoulder pain (Tertiary Area of pain) (Bilateral) (L>R)  Chronic Pain Syndrome  Opiate Use  Long Term Current Use of Opiate Analgesic  Long Term Prescription Benzodiazepine Use    Plan of Care  Pharmacotherapy (Medications Ordered): No orders of the defined types were placed in this encounter.  Orders:          Pharmacological management options:  Opioid Analgesics: We'll take over management today. See above orders Membrane stabilizer: We have discussed the possibility of optimizing this mode of therapy, if tolerated Muscle relaxant: We have discussed the possibility of a trial NSAID: We have discussed the possibility of a trial Other analgesic(s): To be determined at a later time   Interventional management options: Planned, scheduled, and/or pending:    ***   Considering:   Diagnostic Occipital nerve block  Diagnostic left cervical epidural steroid injection Diagnostic Left sided cervical facet block Possible Left-sided cervical radiofrequency ablation    PRN Procedures:   To be determined at a  later time   Provider-requested follow-up: No Follow-up on file.  Future Appointments Date Time Provider [place]  12/13/2016 8:45 AM Milinda Pointer, MD [facility] None    Primary Care Physician: Maurice Small, MD Location: [facility] Note by: Gaspar Cola, MD Date: 12/13/2016; Time: 7:56 AM  There are no Patient Instructions on file for this visit.

## 2016-12-15 DIAGNOSIS — R748 Abnormal levels of other serum enzymes: Secondary | ICD-10-CM | POA: Diagnosis not present

## 2016-12-16 ENCOUNTER — Other Ambulatory Visit: Payer: Self-pay

## 2016-12-28 ENCOUNTER — Other Ambulatory Visit: Payer: Self-pay | Admitting: Pain Medicine

## 2016-12-28 DIAGNOSIS — M5412 Radiculopathy, cervical region: Secondary | ICD-10-CM

## 2016-12-28 DIAGNOSIS — M25511 Pain in right shoulder: Secondary | ICD-10-CM

## 2016-12-28 DIAGNOSIS — R519 Headache, unspecified: Secondary | ICD-10-CM

## 2016-12-28 DIAGNOSIS — G8929 Other chronic pain: Secondary | ICD-10-CM

## 2016-12-28 DIAGNOSIS — M542 Cervicalgia: Secondary | ICD-10-CM

## 2016-12-28 DIAGNOSIS — M25512 Pain in left shoulder: Secondary | ICD-10-CM

## 2016-12-28 DIAGNOSIS — R51 Headache: Principal | ICD-10-CM

## 2016-12-28 DIAGNOSIS — G4486 Cervicogenic headache: Secondary | ICD-10-CM

## 2016-12-28 NOTE — Progress Notes (Deleted)
Patient's Name: Maxwell Richards  MRN: [id number]  Referring Provider: Maurice Small, MD  DOB: 08-13-1970  PCP: Maurice Small, MD  DOS: 12/29/2016  Note by: Gaspar Cola, MD  Service setting: Ambulatory outpatient  Specialty: Interventional Pain Management  Location: [facility] (AMB) Pain Management Facility    Patient type: Established   Primary Reason(s) for Visit: Encounter for evaluation before starting new chronic pain management plan of care (Level of risk: moderate) CC: No chief complaint on file.  HPI  Maxwell Richards is a 46 y.o. year old, male patient, who comes today for a follow-up evaluation to review the test results and decide on a treatment plan. He has Opiate use; Long term current use of opiate analgesic; Long term prescription benzodiazepine use; Chronic pain syndrome; Chronic neck pain (Secondary Area of pain) (Bilateral) (L>R); Chronic head pain (Primary Area of Pain) (Left); and Chronic shoulder pain (Tertiary Area of pain) (Bilateral) (L>R) on his problem list. His primarily concern today is the No chief complaint on file.  Pain Assessment: Location:     Radiating:   Onset:   Duration:   Quality:   Severity:  /10 (self-reported pain score)  Note: Reported level is compatible with observation.                   Effect on ADL:   Timing:   Modifying factors:    Maxwell Richards comes in today for a follow-up visit after his initial evaluation on 11/23/2016. Today we went over the results of his tests. These were explained in "Layman's terms". During today's appointment we went over my diagnostic impression, as well as the proposed treatment plan.  According to the patient's primary area of pain is in the top of his head. He admitted that at the age of 73 he admits that he suffered a blow to this area while getting up from the lower level of a bunk bed. He admits that he tolerated the pain for several years and was seen by many eye doctors for pain behind his eyes. He admits that he did have a  surgical procedure by plastic surgeon to look at the occipital nerve . He admits that it was initially going to be cut the nerve to help relieve his pain. He admits that he has numbness with radicular pain that goes around his face behind his eye. The pain also radiates down into his neck with tightness in the shoulders. He admits that he has ringing in ear. The left side is greater than the right. He denies any interventional procedures. He has had Chinese coin scraping which made the pain worse. He currently uses an activator method technique used by chiropractors. He states this is effective for a while but then returns.  His second area of pain is his neck. He admits the left is greater than the right. He states that he does radiate down into his shoulders. He denies any previous procedures, physical therapy but has had recent images.  Third area of pain is in his shoulders with left being greater than the right. He denies any previous surgeries, physical therapy. He admits that he has had x-rays of his shoulders.  In considering the treatment plan options, Maxwell Richards was reminded that I no longer take patients for medication management only. I asked him to let me know if he had no intention of taking advantage of the interventional therapies, so that we could make arrangements to provide this space to someone interested. I  also made it clear that undergoing interventional therapies for the purpose of getting pain medications is very inappropriate on the part of a patient, and it will not be tolerated in this practice. This type of behavior would suggest true addiction and therefore it requires referral to an addiction specialist.   Further details on both, my assessment(s), as well as the proposed treatment plan, please see below.  Controlled Substance Pharmacotherapy Assessment REMS (Risk Evaluation and Mitigation Strategy)  Analgesic: None Highest recorded MME/day: 60 mg/day MME/day: 0  mg/day Medications: The patient  Pill Count: None expected due to no prior prescriptions written by our practice. No notes on file Pharmacokinetics: Liberation and absorption (onset of action): WNL Distribution (time to peak effect): WNL Metabolism and excretion (duration of action): WNL         Pharmacodynamics: Desired effects: Analgesia: Maxwell Richards reports >50% benefit. Functional ability: Patient reports that medication allows him to accomplish basic ADLs Clinically meaningful improvement in function (CMIF): Sustained CMIF goals met Perceived effectiveness: Described as relatively effective, allowing for increase in activities of daily living (ADL) Undesirable effects: Side-effects or Adverse reactions: None reported Monitoring: [place] PMP: Online review of the past 28-monthperiod previously conducted. Not applicable at this point since we have not taken over the patient's medication management yet. List of all Serum Drug Screening Test(s):  No results found for: AMPHSCRSER, BARBSCRSER, BENZOSCRSER, COCAINSCRSER, PCPSCRSER, THCSCRSER, OPIATESCRSER, O[place]chlocknee PBraidwoodList of all UDS test(s) done:  Lab Results  Component Value Date   SUMMARY FINAL 11/23/2016   Last UDS on record: Summary  Date Value Ref Range Status  11/23/2016 FINAL  Final    Comment:    ==================================================================== TOXASSURE COMP DRUG ANALYSIS,UR ==================================================================== Test                             Result       Flag       Units Drug Present   Lorazepam                      183                     ng/mg creat    Source of lorazepam is a scheduled prescription medication.   Diphenhydramine                PRESENT ==================================================================== Test                      Result    Flag   Units      Ref Range   Creatinine              280              mg/dL       >=20 ==================================================================== Declared Medications:  Medication list was not provided. ==================================================================== For clinical consultation, please call [phone] ====================================================================    UDS interpretation: No unexpected findings.          Medication Assessment Form: Patient introduced to form today Treatment compliance: Treatment may start today if patient agrees with proposed plan. Evaluation of compliance is not applicable at this point Risk Assessment Profile: Aberrant behavior: See initial evaluations. None observed or detected today Comorbid factors increasing risk of overdose: See initial evaluation. No additional risks detected today Medical Psychology Evaluation: Please see scanned results in medical record.     Opioid Risk Tool -  11/23/16 1322      Family History of Substance Abuse   Alcohol Negative   Illegal Drugs Negative   Rx Drugs Negative     Personal History of Substance Abuse   Alcohol Negative   Illegal Drugs Negative   Rx Drugs Negative     Age   Age between 16-45 years  No     History of Preadolescent Sexual Abuse   History of Preadolescent Sexual Abuse Negative or Male     Psychological Disease   Psychological Disease Positive   Depression Positive     Total Score   Opioid Risk Tool Scoring 3   Opioid Risk Interpretation Low Risk     ORT Scoring interpretation table:  Score <3 = Low Risk for SUD  Score between 4-7 = Moderate Risk for SUD  Score >8 = High Risk for Opioid Abuse   Risk Mitigation Strategies:  Patient opioid safety counseling: Completed today. Counseling provided to patient as per "Patient Counseling Document". Document signed by patient, attesting to counseling and understanding Patient-Prescriber Agreement (PPA): Obtained today.  Controlled substance notification to other providers: Written  and sent today.  Pharmacologic Plan: Today we may be taking over the patient's pharmacological regimen. See below             Laboratory Chemistry  Inflammation Markers (CRP: Acute Phase) (ESR: Chronic Phase) Lab Results  Component Value Date   CRP 2.5 11/23/2016   ESRSEDRATE 7 11/23/2016                 Renal Function Markers Lab Results  Component Value Date   BUN 11 11/23/2016   CREATININE 0.85 11/23/2016   GFRAA 120 11/23/2016   GFRNONAA 104 11/23/2016                 Hepatic Function Markers Lab Results  Component Value Date   AST 52 (H) 11/23/2016   ALT 67 (H) 11/23/2016   ALBUMIN 4.4 11/23/2016   ALKPHOS 88 11/23/2016                 Electrolytes Lab Results  Component Value Date   NA 142 11/23/2016   K 4.0 11/23/2016   CL 101 11/23/2016   CALCIUM 9.9 11/23/2016   MG 2.2 11/23/2016                 Neuropathy Markers Lab Results  Component Value Date   VITAMINB12 1,551 (H) 11/23/2016                 Bone Pathology Markers Lab Results  Component Value Date   ALKPHOS 88 11/23/2016   25OHVITD1 22 (L) 11/23/2016   25OHVITD2 <1.0 11/23/2016   25OHVITD3 22 11/23/2016   CALCIUM 9.9 11/23/2016                 Coagulation Parameters Lab Results  Component Value Date   PLT 269 03/21/2008                 Cardiovascular Markers Lab Results  Component Value Date   HGB 16.0 03/21/2008   HCT 47.0 03/21/2008                 Note: Lab results reviewed.  Recent Diagnostic Imaging Review  US Abdomen Complete Result Date: 11/26/2016 CLINICAL DATA:  Epigastric pain EXAM: ABDOMEN ULTRASOUND COMPLETE COMPARISON:  12/03/2015 FINDINGS: Gallbladder: Well distended. A small 5 mm focus is noted without significant shadowing. This lies within the region of the  gallbladder neck and corresponds to a calcifications seen on prior CT examination consistent small gallstone. No complicating factors are seen. Common bile duct: Diameter: 4.2 mm. Liver: Mild increased  echogenicity is noted consistent with fatty infiltration. A 1 cm cyst is noted inferiorly with within the right lobe of the liver. This is also stable from the prior CT. IVC: No abnormality visualized. Pancreas: Visualized portion unremarkable. Spleen: Size and appearance within normal limits. Right Kidney: Length: 12.9 cm. Echogenicity within normal limits. No mass or hydronephrosis visualized. A 1.9 cm cyst is noted. Some increased echogenicity is noted within the wall consistent calcification. Left Kidney: Length: 12.4 cm. Echogenicity within normal limits. No mass or hydronephrosis visualized. Abdominal aorta: No aneurysm visualized. Other findings: None. IMPRESSION: Small gallstone without complicating factors. Hepatic and renal cysts. Electronically Signed   By: Alcide Clever M.D.   On: 11/26/2016 09:14   Note: No results found under the [facility] electronic medical record.          Meds   No outpatient prescriptions have been marked as taking for the 12/29/16 encounter (Appointment) with Delano Metz, MD.    ROS  Constitutional: Denies any fever or chills Gastrointestinal: No reported hemesis, hematochezia, vomiting, or acute GI distress Musculoskeletal: Denies any acute onset joint swelling, redness, loss of ROM, or weakness Neurological: No reported episodes of acute onset apraxia, aphasia, dysarthria, agnosia, amnesia, paralysis, loss of coordination, or loss of consciousness  Allergies  Maxwell Richards has no allergies on file.  PFSH  Drug: Maxwell Richards  has no drug history on file. Alcohol:  has no alcohol history on file. Tobacco:  reports that he has never smoked. He has never used smokeless tobacco. Medical:  has a past medical history of Ang[facility] at [facility]) and Hypertension. Surgical: Maxwell Richards  has a past surgical history that includes Nasal sinus surgery (2006). Family: family history is not on file.  Constitutional Exam  General appearance: Well nourished, well developed,  and well hydrated. In no apparent acute distress There were no vitals filed for this visit. BMI Assessment: Estimated body mass index is 29.19 kg/m as calculated from the following:   Height as of 11/23/16: 5\' 8"  (1.727 m).   Weight as of 11/23/16: 192 lb (87.1 kg).  BMI interpretation table: BMI level Category Range association with higher incidence of chronic pain  <18 kg/m2 Underweight   18.5-24.9 kg/m2 Ideal body weight   25-29.9 kg/m2 Overweight Increased incidence by 20%  30-34.9 kg/m2 Obese (Class I) Increased incidence by 68%  35-39.9 kg/m2 Severe obesity (Class II) Increased incidence by 136%  >40 kg/m2 Extreme obesity (Class III) Increased incidence by 254%   BMI Readings from Last 4 Encounters:  11/23/16 29.19 kg/m   Wt Readings from Last 4 Encounters:  11/23/16 192 lb (87.1 kg)  Psych/Mental status: Alert, oriented x 3 (person, place, & time)       Eyes: PERLA Respiratory: No evidence of acute respiratory distress  Cervical Spine Area Exam  Skin & Axial Inspection: No masses, redness, edema, swelling, or associated skin lesions Alignment: Symmetrical Functional ROM: Unrestricted ROM      Stability: No instability detected Muscle Tone/Strength: Functionally intact. No obvious neuro-muscular anomalies detected. Sensory (Neurological): Unimpaired Palpation: No palpable anomalies              Upper Extremity (UE) Exam    Side: Right upper extremity  Side: Left upper extremity  Skin & Extremity Inspection: Skin color, temperature, and hair growth are WNL.  No peripheral edema or cyanosis. No masses, redness, swelling, asymmetry, or associated skin lesions. No contractures.  Skin & Extremity Inspection: Skin color, temperature, and hair growth are WNL. No peripheral edema or cyanosis. No masses, redness, swelling, asymmetry, or associated skin lesions. No contractures.  Functional ROM: Unrestricted ROM          Functional ROM: Unrestricted ROM          Muscle Tone/Strength:  Functionally intact. No obvious neuro-muscular anomalies detected.  Muscle Tone/Strength: Functionally intact. No obvious neuro-muscular anomalies detected.  Sensory (Neurological): Unimpaired  Sensory (Neurological): Unimpaired  Palpation: No palpable anomalies              Palpation: No palpable anomalies              Specialized Test(s): Deferred         Specialized Test(s): Deferred          Thoracic Spine Area Exam  Skin & Axial Inspection: No masses, redness, or swelling Alignment: Symmetrical Functional ROM: Unrestricted ROM Stability: No instability detected Muscle Tone/Strength: Functionally intact. No obvious neuro-muscular anomalies detected. Sensory (Neurological): Unimpaired Muscle strength & Tone: No palpable anomalies  Lumbar Spine Area Exam  Skin & Axial Inspection: No masses, redness, or swelling Alignment: Symmetrical Functional ROM: Unrestricted ROM      Stability: No instability detected Muscle Tone/Strength: Functionally intact. No obvious neuro-muscular anomalies detected. Sensory (Neurological): Unimpaired Palpation: No palpable anomalies       Provocative Tests: Lumbar Hyperextension and rotation test: evaluation deferred today       Lumbar Lateral bending test: evaluation deferred today       Patrick's Maneuver: evaluation deferred today                    Gait & Posture Assessment  Ambulation: Unassisted Gait: Relatively normal for age and body habitus Posture: WNL   Lower Extremity Exam    Side: Right lower extremity  Side: Left lower extremity  Skin & Extremity Inspection: Skin color, temperature, and hair growth are WNL. No peripheral edema or cyanosis. No masses, redness, swelling, asymmetry, or associated skin lesions. No contractures.  Skin & Extremity Inspection: Skin color, temperature, and hair growth are WNL. No peripheral edema or cyanosis. No masses, redness, swelling, asymmetry, or associated skin lesions. No contractures.  Functional ROM:  Unrestricted ROM          Functional ROM: Unrestricted ROM          Muscle Tone/Strength: Functionally intact. No obvious neuro-muscular anomalies detected.  Muscle Tone/Strength: Functionally intact. No obvious neuro-muscular anomalies detected.  Sensory (Neurological): Unimpaired  Sensory (Neurological): Unimpaired  Palpation: No palpable anomalies  Palpation: No palpable anomalies   Assessment & Plan  Primary Diagnosis & Pertinent Problem List: The primary encounter diagnosis was Chronic pain syndrome. Diagnoses of Chronic head pain (Primary Area of Pain) (Left), Chronic neck pain (Secondary Area of pain) (Bilateral) (L>R), Chronic shoulder pain (Tertiary Area of pain) (Bilateral) (L>R), and Long term prescription benzodiazepine use were also pertinent to this visit.  Visit Diagnosis: 1. Chronic pain syndrome   2. Chronic head pain (Primary Area of Pain) (Left)   3. Chronic neck pain (Secondary Area of pain) (Bilateral) (L>R)   4. Chronic shoulder pain (Tertiary Area of pain) (Bilateral) (L>R)   5. Long term prescription benzodiazepine use    Problems updated and reviewed during this visit: No problems updated.  Plan of Care  Pharmacotherapy (Medications Ordered): No orders of the defined types  were placed in this encounter.  Procedure Orders    No procedure(s) ordered today   Lab Orders  No laboratory test(s) ordered today   Imaging Orders  No imaging studies ordered today   Referral Orders  No referral(s) requested today    Pharmacological management options:  Opioid Analgesics: We'll take over management today. See above orders Membrane stabilizer: We have discussed the possibility of optimizing this mode of therapy, if tolerated Muscle relaxant: We have discussed the possibility of a trial NSAID: We have discussed the possibility of a trial Other analgesic(s): To be determined at a later time   Interventional management options: Planned, scheduled, and/or pending:     ***   Considering:   Diagnostic Occipital nerve block  Diagnostic left cervical epidural steroid injection Diagnostic Left sided cervical facet block Possible Left-sided cervical radiofrequency ablation    PRN Procedures:   To be determined at a later time   Provider-requested follow-up: No Follow-up on file.  Future Appointments Date Time Provider [place]  12/29/2016 8:00 AM Milinda Pointer, MD [facility] None    Primary Care Physician: Maurice Small, MD Location: [facility] Out[facility] Note by: Gaspar Cola, MD Date: 12/29/2016; Time: 3:47 PM

## 2016-12-29 ENCOUNTER — Ambulatory Visit: Payer: BLUE CROSS/BLUE SHIELD | Admitting: Pain Medicine

## 2017-01-20 DIAGNOSIS — Z202 Contact with and (suspected) exposure to infections with a predominantly sexual mode of transmission: Secondary | ICD-10-CM | POA: Diagnosis not present

## 2017-01-20 DIAGNOSIS — I10 Essential (primary) hypertension: Secondary | ICD-10-CM | POA: Diagnosis not present

## 2017-01-20 DIAGNOSIS — R740 Nonspecific elevation of levels of transaminase and lactic acid dehydrogenase [LDH]: Secondary | ICD-10-CM | POA: Diagnosis not present

## 2017-01-20 DIAGNOSIS — E119 Type 2 diabetes mellitus without complications: Secondary | ICD-10-CM | POA: Diagnosis not present

## 2017-01-23 NOTE — Progress Notes (Signed)
Patient's Name: Maxwell Richards  MRN: [id number]  Referring Provider: Maurice Small, MD  DOB: [birth year]  PCP: Maurice Small, MD  DOS: 01/24/2017  Note by: Gaspar Cola, MD  Service setting: Ambulatory outpatient  Specialty: Interventional Pain Management  Location: [facility] (AMB) Pain Management Facility    Patient type: Established   Primary Reason(s) for Visit: Encounter for evaluation before starting new chronic pain management plan of care (Level of risk: moderate) CC: Neck Pain (bilateral) and Eye Pain (bilateral, tightness)  HPI  Maxwell Richards is a 46 y.o. year old, male patient, who comes today for a follow-up evaluation to review the test results and decide on a treatment plan. He has Long term prescription benzodiazepine use; Chronic pain syndrome; Chronic neck pain (Secondary Area of pain) (Bilateral) (L>R); Chronic head pain (Primary Area of Pain) (Left); Chronic shoulder pain [facility] Area of pain) (Bilateral) (L>R); Cervicogenic headache; Occipital headache (Left); Radicular pain of shoulder; Disorder of skeletal system; Pharmacologic therapy; and Problems influencing health status on his problem list. His primarily concern today is the Neck Pain (bilateral) and Eye Pain (bilateral, tightness)  Pain Assessment: Location: Right, Left (patient states that the injury was caused by a hard blow to the head when he hit his head on a bunk bed at the age of 60.  States it is the occipital nerve causing pain at the site and tightness to neck and eyes) Neck (eyes, head) Radiating: na Onset: More than a month ago Duration: Chronic pain Quality: Sharp, Tightness Severity: 3 /10 (self-reported pain score)  Note: Reported level is compatible with observation.                   Effect on ADL: when patient has a flare it leaves him somewhat incapacitated unable to do activities.  patient tries to avoid things that flare Timing: Constant Modifying factors: sometimes chiropractor helps but nerve pinch remains  there  Maxwell Richards comes in today for a follow-up visit after his initial evaluation on 12/29/2016. Today we went over the results of his tests. These were explained in "Layman's terms". During today's appointment we went over my diagnostic impression, as well as the proposed treatment plan.  According to the patient's primary area of pain is in the top of his head. He admitted that at the age of 9 he admits that he suffered a blow to this area while getting up from the lower level of a bunk bed. He admits that he tolerated the pain for several years and was seen by many eye doctors for pain behind his eyes. He admits that he did have a surgical procedure by plastic surgeon to look at the occipital nerve . He admits that it was initially going to be cut the nerve to help relieve his pain. He admits that he has numbness with radicular pain that goes around his face behind his eye. The pain also radiates down into his neck with tightness in the shoulders. He admits that he has ringing in ear. The left side is greater than the right. He denies any interventional procedures. He has had Chinese coin scraping which made the pain worse. He currently uses an activator method technique used by chiropractors. He states this is effective for a while but then returns.  His second area of pain is his neck. He admits the left is greater than the right. He states that he does radiate down into her shoulders. He denies any previous procedures, physical therapy but has  had recent images.  Third area of pain is in his shoulders with left being greater than the right. He denies any previous surgeries, physical therapy. He admits that he has had x-rays of his shoulders.   In considering the treatment plan options, Maxwell Richards was reminded that I no longer take patients for medication management only. I asked him to let me know if he had no intention of taking advantage of the interventional therapies, so that we could make  arrangements to provide this space to someone interested. I also made it clear that undergoing interventional therapies for the purpose of getting pain medications is very inappropriate on the part of a patient, and it will not be tolerated in this practice. This type of behavior would suggest true addiction and therefore it requires referral to an addiction specialist.   Further details on both, my assessment(s), as well as the proposed treatment plan, please see below.  Controlled Substance Pharmacotherapy Assessment REMS (Risk Evaluation and Mitigation Strategy)  Analgesic: None Highest recorded MME/day: 60 mg/day MME/day: 0 mg/day Pill Count: None expected due to no prior prescriptions written by our practice. No notes on file Pharmacokinetics: Liberation and absorption (onset of action): WNL Distribution (time to peak effect): WNL Metabolism and excretion (duration of action): WNL         Pharmacodynamics: Desired effects: Analgesia: Maxwell Richards reports >50% benefit. Functional ability: Patient reports that medication allows him to accomplish basic ADLs Clinically meaningful improvement in function (CMIF): Sustained CMIF goals met Perceived effectiveness: Described as relatively effective, allowing for increase in activities of daily living (ADL) Undesirable effects: Side-effects or Adverse reactions: None reported Monitoring:  PMP: Online review of the past 73-monthperiod previously conducted. Not applicable at this point since we have not taken over the patient's medication management yet. List of all Serum Drug Screening Test(s):  No results found for: AMPHSCRSER, BARBSCRSER, BENZOSCRSER, COCAINSCRSER, PCPSCRSER, THCSCRSER, OPIATESCRSER, [place] Lake[place] of [place] UDS test(s) done:  Lab Results  Component Value Date   SUMMARY FINAL 11/23/2016   Last UDS on record: Summary  Date Value Ref Range Status  11/23/2016 FINAL  Final    Comment:     ==================================================================== TOXASSURE COMP DRUG ANALYSIS,UR ==================================================================== Test                             Result       Flag       Units Drug Present   Lorazepam                      183                     ng/mg creat    Source of lorazepam is a scheduled prescription medication.   Diphenhydramine                PRESENT ==================================================================== Test                      Result    Flag   Units      Ref Range   Creatinine              280              mg/dL      >=20 ==================================================================== Declared Medications:  Medication list was not provided. ==================================================================== For clinical consultation, please call ([phone] ====================================================================  UDS interpretation: No unexpected findings.          Medication Assessment Form: Patient introduced to form today Treatment compliance: Treatment may start today if patient agrees with proposed plan. Evaluation of compliance is not applicable at this point Risk Assessment Profile: Aberrant behavior: See initial evaluations. None observed or detected today Comorbid factors increasing risk of overdose: See initial evaluation. No additional risks detected today Medical Psychology Evaluation: Please see scanned results in medical record.     Opioid Risk Tool - 01/24/17 1108      Family History of Substance Abuse   Alcohol Negative   Illegal Drugs Negative   Rx Drugs Negative     Personal History of Substance Abuse   Alcohol Negative   Illegal Drugs Negative   Rx Drugs Negative     Psychological Disease   Psychological Disease Positive   Depression Positive  uses paxil and ativan for anxiety     Total Score   Opioid Risk Tool Scoring 3   Opioid Risk  Interpretation Low Risk     ORT Scoring interpretation table:  Score <3 = Low Risk for SUD  Score between 4-7 = Moderate Risk for SUD  Score >8 = High Risk for Opioid Abuse   Risk Mitigation Strategies:  Patient opioid safety counseling: Completed today. Counseling provided to patient as per "Patient Counseling Document". Document signed by patient, attesting to counseling and understanding Patient-Prescriber Agreement (PPA): Obtained today.  Controlled substance notification to other providers: Written and sent today.  Pharmacologic Plan: Today we may be taking over the patient's pharmacological regimen. See below             Laboratory Chemistry  Inflammation Markers (CRP: Acute Phase) (ESR: Chronic Phase) Lab Results  Component Value Date   CRP 2.5 11/23/2016   ESRSEDRATE 7 11/23/2016                 Renal Function Markers Lab Results  Component Value Date   BUN 11 11/23/2016   CREATININE 0.85 11/23/2016   GFRAA 120 11/23/2016   GFRNONAA 104 11/23/2016                 Hepatic Function Markers Lab Results  Component Value Date   AST 52 (H) 11/23/2016   ALT 67 (H) 11/23/2016   ALBUMIN 4.4 11/23/2016   ALKPHOS 88 11/23/2016                 Electrolytes Lab Results  Component Value Date   NA 142 11/23/2016   K 4.0 11/23/2016   CL 101 11/23/2016   CALCIUM 9.9 11/23/2016   MG 2.2 11/23/2016                 Neuropathy Markers Lab Results  Component Value Date   VITAMINB12 1,551 (H) 11/23/2016                 Bone Pathology Markers Lab Results  Component Value Date   ALKPHOS 88 11/23/2016   25OHVITD1 22 (L) 11/23/2016   25OHVITD2 <1.0 11/23/2016   25OHVITD3 22 11/23/2016   CALCIUM 9.9 11/23/2016                 Coagulation Parameters Lab Results  Component Value Date   PLT 269 03/21/2008                 Cardiovascular Markers Lab Results  Component Value Date   HGB 16.0 03/21/2008   HCT 47.0 03/21/2008  Note: Lab results  reviewed.  Recent Diagnostic Imaging Review   Note: No results found under the [facility] electronic medical record.          Meds   Current Outpatient Prescriptions:  .  amLODipine (NORVASC) 5 MG tablet, Take 5 mg by mouth daily., Disp: , Rfl:  .  hydrochlorothiazide (MICROZIDE) 12.5 MG capsule, Take 12.5 mg by mouth daily., Disp: , Rfl: 4 .  LORazepam (ATIVAN) 1 MG tablet, Take 1 mg by mouth as needed for anxiety., Disp: , Rfl:  .  PARoxetine (PAXIL) 10 MG tablet, Take 10 mg by mouth daily., Disp: , Rfl:   ROS  Constitutional: Denies any fever or chills Gastrointestinal: No reported hemesis, hematochezia, vomiting, or acute GI distress Musculoskeletal: Denies any acute onset joint swelling, redness, loss of ROM, or weakness Neurological: No reported episodes of acute onset apraxia, aphasia, dysarthria, agnosia, amnesia, paralysis, loss of coordination, or loss of consciousness  Allergies  Maxwell Richards is allergic to cyclobenzaprine; other; and robaxin [methocarbamol].  Maxwell Richards  Drug: Maxwell Richards  reports that he does not use drugs. Alcohol:  reports that he drinks alcohol. Tobacco:  reports that he has never smoked. He has never used smokeless tobacco. Medical:  has a past medical history of Angina at [facility]) and Hypertension. Surgical: Maxwell Richards  has a past surgical history that includes Nasal sinus surgery (2006). Family: family history is not on file.  Constitutional Exam  General appearance: Well nourished, well developed, and well hydrated. In no apparent acute distress Vitals:   01/24/17 1058  BP: (!) 141/94  Pulse: 77  Resp: 16  Temp: 97.9 F (36.6 C)  TempSrc: Oral  SpO2: 99%  Weight: 190 lb (86.2 kg)  Height: '5\' 8"'$  (1.727 m)   BMI Assessment: Estimated body mass index is 28.89 kg/m as calculated from the following:   Height as of this encounter: '5\' 8"'$  (1.727 m).   Weight as of this encounter: 190 lb (86.2 kg).  BMI interpretation table: BMI level Category  Range association with higher incidence of chronic pain  <18 kg/m2 Underweight   18.5-24.9 kg/m2 Ideal body weight   25-29.9 kg/m2 Overweight Increased incidence by 20%  30-34.9 kg/m2 Obese (Class I) Increased incidence by 68%  35-39.9 kg/m2 Severe obesity (Class II) Increased incidence by 136%  >40 kg/m2 Extreme obesity (Class III) Increased incidence by 254%   BMI Readings from Last 4 Encounters:  01/24/17 28.89 kg/m  11/23/16 29.19 kg/m   Wt Readings from Last 4 Encounters:  01/24/17 190 lb (86.2 kg)  11/23/16 192 lb (87.1 kg)  Psych/Mental status: Alert, oriented x 3 (person, place, & time)       Eyes: PERLA Respiratory: No evidence of acute respiratory distress  Cervical Spine Area Exam  Skin & Axial Inspection: No masses, redness, edema, swelling, or associated skin lesions Alignment: Symmetrical Functional ROM: Decreased ROM      Stability: No instability detected Muscle Tone/Strength: Functionally intact. No obvious neuro-muscular anomalies detected. Sensory (Neurological): Movement-associated discomfort Palpation: Complains of area being tender to palpation Positive provocative maneuver for for bilateral occipital neuralgia  Upper Extremity (UE) Exam    Side: Right upper extremity  Side: Left upper extremity  Skin & Extremity Inspection: Skin color, temperature, and hair growth are WNL. No peripheral edema or cyanosis. No masses, redness, swelling, asymmetry, or associated skin lesions. No contractures.  Skin & Extremity Inspection: Skin color, temperature, and hair growth are WNL. No peripheral edema or cyanosis. No masses,  redness, swelling, asymmetry, or associated skin lesions. No contractures.  Functional ROM: Unrestricted ROM          Functional ROM: Unrestricted ROM          Muscle Tone/Strength: Functionally intact. No obvious neuro-muscular anomalies detected.  Muscle Tone/Strength: Functionally intact. No obvious neuro-muscular anomalies detected.  Sensory  (Neurological): Unimpaired          Sensory (Neurological): Unimpaired          Palpation: No palpable anomalies              Palpation: No palpable anomalies              Specialized Test(s): Deferred         Specialized Test(s): Deferred          Thoracic Spine Area Exam  Skin & Axial Inspection: No masses, redness, or swelling Alignment: Symmetrical Functional ROM: Unrestricted ROM Stability: No instability detected Muscle Tone/Strength: Functionally intact. No obvious neuro-muscular anomalies detected. Sensory (Neurological): Unimpaired Muscle strength & Tone: No palpable anomalies  Lumbar Spine Area Exam  Skin & Axial Inspection: No masses, redness, or swelling Alignment: Symmetrical Functional ROM: Unrestricted ROM      Stability: No instability detected Muscle Tone/Strength: Functionally intact. No obvious neuro-muscular anomalies detected. Sensory (Neurological): Unimpaired Palpation: No palpable anomalies       Provocative Tests: Lumbar Hyperextension and rotation test: evaluation deferred today       Lumbar Lateral bending test: evaluation deferred today       Patrick's Maneuver: evaluation deferred today                    Gait & Posture Assessment  Ambulation: Unassisted Gait: Relatively normal for age and body habitus Posture: WNL   Lower Extremity Exam    Side: Right lower extremity  Side: Left lower extremity  Skin & Extremity Inspection: Skin color, temperature, and hair growth are WNL. No peripheral edema or cyanosis. No masses, redness, swelling, asymmetry, or associated skin lesions. No contractures.  Skin & Extremity Inspection: Skin color, temperature, and hair growth are WNL. No peripheral edema or cyanosis. No masses, redness, swelling, asymmetry, or associated skin lesions. No contractures.  Functional ROM: Unrestricted ROM          Functional ROM: Unrestricted ROM          Muscle Tone/Strength: Functionally intact. No obvious neuro-muscular anomalies  detected.  Muscle Tone/Strength: Functionally intact. No obvious neuro-muscular anomalies detected.  Sensory (Neurological): Unimpaired  Sensory (Neurological): Unimpaired  Palpation: No palpable anomalies  Palpation: No palpable anomalies   Assessment & Plan  Primary Diagnosis & Pertinent Problem List: The primary encounter diagnosis was Chronic head pain (Primary Area of Pain) (Left). Diagnoses of Chronic neck pain (Secondary Area of pain) (Bilateral) (L>R), Cervicogenic headache, Occipital headache, Chronic shoulder pain (Tertiary Area of pain) (Bilateral) (L>R), Radicular pain of shoulder, Chronic pain syndrome, Disorder of skeletal system, Pharmacologic therapy, Problems influencing health status, Long term current use of opiate analgesic, Long term prescription benzodiazepine use, and Opiate use were also pertinent to this visit.  Visit Diagnosis: 1. Chronic head pain (Primary Area of Pain) (Left)   2. Chronic neck pain (Secondary Area of pain) (Bilateral) (L>R)   3. Cervicogenic headache   4. Occipital headache   5. Chronic shoulder pain [facility] Area of pain) (Bilateral) (L>R)   6. Radicular pain of shoulder   7. Chronic pain syndrome   8. Disorder of skeletal system   9. Pharmacologic  therapy   10. Problems influencing health status   11. Long term current use of opiate analgesic   12. Long term prescription benzodiazepine use   13. Opiate use    Problems updated and reviewed during this visit: Problem  Occipital headache (Left)  Disorder of Skeletal System  Pharmacologic Therapy  Problems Influencing Health Status    Plan of Care  Pharmacotherapy (Medications Ordered): No orders of the defined types were placed in this encounter.   Procedure Orders     GREATER OCCIPITAL NERVE BLOCK   Lab Orders  No laboratory test(s) ordered today   Imaging Orders  No imaging studies ordered today   Referral Orders  No referral(s) requested today    Pharmacological  management options:  Opioid Analgesics: Maxwell Richards would like to stay away from opioids, if at all possible Membrane stabilizer: Options discussed, Maxwell Richards would prefer to avaoid taking any Muscle relaxant: Options discussed, Maxwell Richards would prefer not taking any NSAID: Options discussed, Maxwell Richards would prefer avoiding them Other analgesic(s): Some options discussed, Maxwell Richards would prefer taking as few medications as possible   Interventional management options: Planned, scheduled, and/or pending:    Diagnostic left sided greater occipital nerve block under fluoroscopic guidance, no sedation.    Considering:   Diagnostic left sided greater occipital nerve block  Possible left-sided greater occipital nerve RFA Diagnostic left-sided C2 + TON nerve block Possible left-sided C2 + TON RFA  Possible diagnostic left sided occipital peripheral nerve stimulator trial  Diagnostic left cervical epidural steroid injection Diagnostic left sided cervical facet block Possible left-sided cervical facet RFA    PRN Procedures:   To be determined at a later time   Provider-requested follow-up: Return for Procedure (no sedation): (L) GONB.  No future appointments.  Primary Care Physician: Maurice Small, MD Location: [facility] Outpatient Pain Management Facility Note by: Gaspar Cola, MD Date: 01/24/2017; Time: 12:49 PM

## 2017-01-24 ENCOUNTER — Ambulatory Visit
Admission: RE | Admit: 2017-01-24 | Discharge: 2017-01-24 | Disposition: A | Discharge status: Home / Self Care | Payer: BLUE CROSS/BLUE SHIELD | Source: Ambulatory Visit | Attending: Pain Medicine | Admitting: Pain Medicine

## 2017-01-24 ENCOUNTER — Ambulatory Visit: Payer: BLUE CROSS/BLUE SHIELD | Attending: Pain Medicine | Admitting: Pain Medicine

## 2017-01-24 ENCOUNTER — Encounter: Payer: Self-pay | Admitting: Pain Medicine

## 2017-01-24 VITALS — BP 141/94 | HR 77 | Temp 97.9°F | Resp 16 | Ht 68.0 in | Wt 190.0 lb

## 2017-01-24 DIAGNOSIS — M5412 Radiculopathy, cervical region: Secondary | ICD-10-CM

## 2017-01-24 DIAGNOSIS — Z79891 Long term (current) use of opiate analgesic: Secondary | ICD-10-CM | POA: Diagnosis not present

## 2017-01-24 DIAGNOSIS — Z79899 Other long term (current) drug therapy: Secondary | ICD-10-CM | POA: Diagnosis not present

## 2017-01-24 DIAGNOSIS — M25511 Pain in right shoulder: Secondary | ICD-10-CM

## 2017-01-24 DIAGNOSIS — F1390 Sedative, hypnotic, or anxiolytic use, unspecified, uncomplicated: Secondary | ICD-10-CM | POA: Diagnosis not present

## 2017-01-24 DIAGNOSIS — M542 Cervicalgia: Secondary | ICD-10-CM | POA: Diagnosis not present

## 2017-01-24 DIAGNOSIS — M899 Disorder of bone, unspecified: Secondary | ICD-10-CM | POA: Diagnosis not present

## 2017-01-24 DIAGNOSIS — R51 Headache: Secondary | ICD-10-CM | POA: Insufficient documentation

## 2017-01-24 DIAGNOSIS — F1190 Opioid use, unspecified, uncomplicated: Secondary | ICD-10-CM | POA: Diagnosis not present

## 2017-01-24 DIAGNOSIS — M25512 Pain in left shoulder: Secondary | ICD-10-CM

## 2017-01-24 DIAGNOSIS — G894 Chronic pain syndrome: Secondary | ICD-10-CM | POA: Diagnosis not present

## 2017-01-24 DIAGNOSIS — H5713 Ocular pain, bilateral: Secondary | ICD-10-CM | POA: Insufficient documentation

## 2017-01-24 DIAGNOSIS — Z789 Other specified health status: Secondary | ICD-10-CM | POA: Diagnosis not present

## 2017-01-24 DIAGNOSIS — I10 Essential (primary) hypertension: Secondary | ICD-10-CM | POA: Insufficient documentation

## 2017-01-24 DIAGNOSIS — G4486 Cervicogenic headache: Secondary | ICD-10-CM

## 2017-01-24 DIAGNOSIS — R519 Headache, unspecified: Secondary | ICD-10-CM

## 2017-01-24 DIAGNOSIS — G8929 Other chronic pain: Secondary | ICD-10-CM

## 2017-01-24 NOTE — Patient Instructions (Addendum)
____________________________________________________________________________________________  Preparing for your procedure (without sedation) Instructions: . Oral Intake: Do not eat or drink anything for at least 3 hours prior to your procedure. . Transportation: Unless otherwise stated by your physician, you may drive yourself after the procedure. . Blood Pressure Medicine: Take your blood pressure medicine with a sip of water the morning of the procedure. . Blood thinners:  . Diabetics on insulin: Notify the staff so that you can be scheduled 1st case in the morning. If your diabetes requires high dose insulin, take only  of your normal insulin dose the morning of the procedure and notify the staff that you have done so. . Preventing infections: Shower with an antibacterial soap the morning of your procedure.  . Build-up your immune system: Take 1000 mg of Vitamin C with every meal (3 times a day) the day prior to your procedure. Marland Kitchen. Antibiotics: Inform the staff if you have a condition or reason that requires you to take antibiotics before dental procedures. . Pregnancy: If you are pregnant, call and cancel the procedure. . Sickness: If you have a cold, fever, or any active infections, call and cancel the procedure. . Arrival: You must be in the facility at least 30 minutes prior to your scheduled procedure. . Children: Do not bring any children with you. . Dress appropriately: Bring dark clothing that you would not mind if they get stained. . Valuables: Do not bring any jewelry or valuables. Procedure appointments are reserved for interventional treatments only. Marland Kitchen. No Prescription Refills. . No medication changes will be discussed during procedure appointments. . No disability issues will be discussed. ____________________________________________________________________________________________  GENERAL RISKS AND COMPLICATIONS  What are the risk, side effects and possible  complications? Generally speaking, most procedures are safe.  However, with any procedure there are risks, side effects, and the possibility of complications.  The risks and complications are dependent upon the sites that are lesioned, or the type of nerve block to be performed.  The closer the procedure is to the spine, the more serious the risks are.  Great care is taken when placing the radio frequency needles, block needles or lesioning probes, but sometimes complications can occur. 1. Infection: Any time there is an injection through the skin, there is a risk of infection.  This is why sterile conditions are used for these blocks.  There are four possible types of infection. 1. Localized skin infection. 2. Central Nervous System Infection-This can be in the form of Meningitis, which can be deadly. 3. Epidural Infections-This can be in the form of an epidural abscess, which can cause pressure inside of the spine, causing compression of the spinal cord with subsequent paralysis. This would require an emergency surgery to decompress, and there are no guarantees that the patient would recover from the paralysis. 4. Discitis-This is an infection of the intervertebral discs.  It occurs in about 1% of discography procedures.  It is difficult to treat and it may lead to surgery.        2. Pain: the needles have to go through skin and soft tissues, will cause soreness.       3. Damage to internal structures:  The nerves to be lesioned may be near blood vessels or    other nerves which can be potentially damaged.       4. Bleeding: Bleeding is more common if the patient is taking blood thinners such as  aspirin, Coumadin, Ticiid, Plavix, etc., or if he/she have some genetic predisposition  such as hemophilia. Bleeding into the spinal canal can cause compression of the spinal  cord with subsequent paralysis.  This would require an emergency surgery to  decompress and there are no guarantees that the patient  would recover from the  paralysis.       5. Pneumothorax:  Puncturing of a lung is a possibility, every time a needle is introduced in  the area of the chest or upper back.  Pneumothorax refers to free air around the  collapsed lung(s), inside of the thoracic cavity (chest cavity).  Another two possible  complications related to a similar event would include: Hemothorax and Chylothorax.   These are variations of the Pneumothorax, where instead of air around the collapsed  lung(s), you may have blood or chyle, respectively.       6. Spinal headaches: They may occur with any procedures in the area of the spine.       7. Persistent CSF (Cerebro-Spinal Fluid) leakage: This is a rare problem, but may occur  with prolonged intrathecal or epidural catheters either due to the formation of a fistulous  track or a dural tear.       8. Nerve damage: By working so close to the spinal cord, there is always a possibility of  nerve damage, which could be as serious as a permanent spinal cord injury with  paralysis.       9. Death:  Although rare, severe deadly allergic reactions known as "Anaphylactic  reaction" can occur to any of the medications used.      10. Worsening of the symptoms:  We can always make thing worse.  What are the chances of something like this happening? Chances of any of this occuring are extremely low.  By statistics, you have more of a chance of getting killed in a motor vehicle accident: while driving to the hospital than any of the above occurring .  Nevertheless, you should be aware that they are possibilities.  In general, it is similar to taking a shower.  Everybody knows that you can slip, hit your head and get killed.  Does that mean that you should not shower again?  Nevertheless always keep in mind that statistics do not mean anything if you happen to be on the wrong side of them.  Even if a procedure has a 1 (one) in a 1,000,000 (million) chance of going wrong, it you happen to be that  one..Also, keep in mind that by statistics, you have more of a chance of having something go wrong when taking medications.  Who should not have this procedure? If you are on a blood thinning medication (e.g. Coumadin, Plavix, see list of "Blood Thinners"), or if you have an active infection going on, you should not have the procedure.  If you are taking any blood thinners, please inform your physician.  How should I prepare for this procedure?  Do not eat or drink anything at least six hours prior to the procedure.  Bring a driver with you .  It cannot be a taxi.  Come accompanied by an adult that can drive you back, and that is strong enough to help you if your legs get weak or numb from the local anesthetic.  Take all of your medicines the morning of the procedure with just enough water to swallow them.  If you have diabetes, make sure that you are scheduled to have your procedure done first thing in the morning, whenever possible.  If you  have diabetes, take only half of your insulin dose and notify our nurse that you have done so as soon as you arrive at the clinic.  If you are diabetic, but only take blood sugar pills (oral hypoglycemic), then do not take them on the morning of your procedure.  You may take them after you have had the procedure.  Do not take aspirin or any aspirin-containing medications, at least eleven (11) days prior to the procedure.  They may prolong bleeding.  Wear loose fitting clothing that may be easy to take off and that you would not mind if it got stained with Betadine or blood.  Do not wear any jewelry or perfume  Remove any nail coloring.  It will interfere with some of our monitoring equipment.  NOTE: Remember that this is not meant to be interpreted as a complete list of all possible complications.  Unforeseen problems may occur.  BLOOD THINNERS The following drugs contain aspirin or other products, which can cause increased bleeding during  surgery and should not be taken for 2 weeks prior to and 1 week after surgery.  If you should need take something for relief of minor pain, you may take acetaminophen which is found in Tylenol,m Datril, Anacin-3 and Panadol. It is not blood thinner. The products listed below are.  Do not take any of the products listed below in addition to any listed on your instruction sheet.  A.P.C or A.P.C with Codeine Codeine Phosphate Capsules #3 Ibuprofen Ridaura  ABC compound Congesprin Imuran rimadil  Advil Cope Indocin Robaxisal  Alka-Seltzer Effervescent Pain Reliever and Antacid Coricidin or Coricidin-D  Indomethacin Rufen  Alka-Seltzer plus Cold Medicine Cosprin Ketoprofen S-A-C Tablets  Anacin Analgesic Tablets or Capsules Coumadin Korlgesic Salflex  Anacin Extra Strength Analgesic tablets or capsules CP-2 Tablets Lanoril Salicylate  Anaprox Cuprimine Capsules Levenox Salocol  Anexsia-D Dalteparin Magan Salsalate  Anodynos Darvon compound Magnesium Salicylate Sine-off  Ansaid Dasin Capsules Magsal Sodium Salicylate  Anturane Depen Capsules Marnal Soma  APF Arthritis pain formula Dewitt's Pills Measurin Stanback  Argesic Dia-Gesic Meclofenamic Sulfinpyrazone  Arthritis Bayer Timed Release Aspirin Diclofenac Meclomen Sulindac  Arthritis pain formula Anacin Dicumarol Medipren Supac  Analgesic (Safety coated) Arthralgen Diffunasal Mefanamic Suprofen  Arthritis Strength Bufferin Dihydrocodeine Mepro Compound Suprol  Arthropan liquid Dopirydamole Methcarbomol with Aspirin Synalgos  ASA tablets/Enseals Disalcid Micrainin Tagament  Ascriptin Doan's Midol Talwin  Ascriptin A/D Dolene Mobidin Tanderil  Ascriptin Extra Strength Dolobid Moblgesic Ticlid  Ascriptin with Codeine Doloprin or Doloprin with Codeine Momentum Tolectin  Asperbuf Duoprin Mono-gesic Trendar  Aspergum Duradyne Motrin or Motrin IB Triminicin  Aspirin plain, buffered or enteric coated Durasal Myochrisine Trigesic  Aspirin  Suppositories Easprin Nalfon Trillsate  Aspirin with Codeine Ecotrin Regular or Extra Strength Naprosyn Uracel  Atromid-S Efficin Naproxen Ursinus  Auranofin Capsules Elmiron Neocylate Vanquish  Axotal Emagrin Norgesic Verin  Azathioprine Empirin or Empirin with Codeine Normiflo Vitamin E  Azolid Emprazil Nuprin Voltaren  Bayer Aspirin plain, buffered or children's or timed BC Tablets or powders Encaprin Orgaran Warfarin Sodium  Buff-a-Comp Enoxaparin Orudis Zorpin  Buff-a-Comp with Codeine Equegesic Os-Cal-Gesic   Buffaprin Excedrin plain, buffered or Extra Strength Oxalid   Bufferin Arthritis Strength Feldene Oxphenbutazone   Bufferin plain or Extra Strength Feldene Capsules Oxycodone with Aspirin   Bufferin with Codeine Fenoprofen Fenoprofen Pabalate or Pabalate-SF   Buffets II Flogesic Panagesic   Buffinol plain or Extra Strength Florinal or Florinal with Codeine Panwarfarin   Buf-Tabs Flurbiprofen Penicillamine   Butalbital Compound Four-way  cold tablets Penicillin   Butazolidin Fragmin Pepto-Bismol   Carbenicillin Geminisyn Percodan   Carna Arthritis Reliever Geopen Persantine   Carprofen Gold's salt Persistin   Chloramphenicol Goody's Phenylbutazone   Chloromycetin Haltrain Piroxlcam   Clmetidine heparin Plaquenil   Cllnoril Hyco-pap Ponstel   Clofibrate Hydroxy chloroquine Propoxyphen         Before stopping any of these medications, be sure to consult the physician who ordered them.  Some, such as Coumadin (Warfarin) are ordered to prevent or treat serious conditions such as "deep thrombosis", "pumonary embolisms", and other heart problems.  The amount of time that you may need off of the medication may also vary with the medication and the reason for which you were taking it.  If you are taking any of these medications, please make sure you notify your pain physician before you undergo any procedures.         Occipital Nerve Block Patient  Information  Description: The occipital nerves originate in the cervical (neck) spinal cord and travel upward through muscle and tissue to supply sensation to the back of the head and top of the scalp.  In addition, the nerves control some of the muscles of the scalp.  Occipital neuralgia is an irritation of these nerves which can cause headaches, numbness of the scalp, and neck discomfort.     The occipital nerve block will interrupt nerve transmission through these nerves and can relieve pain and spasm.  The block consists of insertion of a small needle under the skin in the back of the head to deposit local anesthetic (numbing medicine) and/or steroids around the nerve.  The entire block usually lasts less than 5 minutes.  Conditions which may be treated by occipital blocks:   Muscular pain and spasm of the scalp  Nerve irritation, back of the head  Headaches  Upper neck pain  Preparation for the injection:  12. Do not eat any solid food or dairy products within 8 hours of your appointment. 13. You may drink clear liquids up to 3 hours before appointment.  Clear liquids include water, black coffee, juice or soda.  No milk or cream please. 14. You may take your regular medication, including pain medications, with a sip of water before you appointment.  Diabetics should hold regular insulin (if taken separately) and take 1/2 normal NPH dose the morning of the procedure.  Carry some sugar containing items with you to your appointment. 15. A driver must accompany you and be prepared to drive you home after your procedure. 16. Bring all your current medications with you. 17. An IV may be inserted and sedation may be given at the discretion of the physician. 18. A blood pressure cuff, EKG, and other monitors will often be applied during the procedure.  Some patients may need to have extra oxygen administered for a short period. 19. You will be asked to provide medical information, including  your allergies and medications, prior to the procedure.  We must know immediately if you are taking blood thinners (like Coumadin/Warfarin) or if you are allergic to IV iodine contrast (dye).  We must know if you could possible be pregnant.  20. Do not wear a high collared shirt or turtleneck.  Tie long hair up in the back if possible.  Possible side-effects:   Bleeding from needle site  Infection (rare, may require surgery)  Nerve injury (rare)  Hair on back of neck can be tinged with iodine scrub (this will wash out)  Light-headedness (temporary)  Pain at injection site (several days)  Decreased blood pressure (rare, temporary)  Seizure (very rare)  Call if you experience:   Hives or difficulty breathing ( go to the emergency room)  Inflammation or drainage at the injection site(s)  Please note:  Although the local anesthetic injected can often make your painful muscles or headache feel good for several hours after the injection, the pain may return.  It takes 3-7 days for steroids to work.  You may not notice any pain relief for at least one week.  If effective, we will often do a series of injections spaced 3-6 weeks apart to maximally decrease your pain.  If you have any questions, please call [phone] [facility]

## 2017-01-25 DIAGNOSIS — Z789 Other specified health status: Secondary | ICD-10-CM | POA: Insufficient documentation

## 2017-01-25 DIAGNOSIS — M899 Disorder of bone, unspecified: Secondary | ICD-10-CM | POA: Insufficient documentation

## 2017-01-25 DIAGNOSIS — Z79899 Other long term (current) drug therapy: Secondary | ICD-10-CM | POA: Insufficient documentation

## 2017-02-07 DIAGNOSIS — I10 Essential (primary) hypertension: Secondary | ICD-10-CM | POA: Diagnosis not present

## 2017-02-07 DIAGNOSIS — E119 Type 2 diabetes mellitus without complications: Secondary | ICD-10-CM | POA: Diagnosis not present

## 2017-02-07 DIAGNOSIS — B20 Human immunodeficiency virus [HIV] disease: Secondary | ICD-10-CM | POA: Diagnosis not present

## 2017-02-07 DIAGNOSIS — E785 Hyperlipidemia, unspecified: Secondary | ICD-10-CM | POA: Diagnosis not present

## 2017-02-07 DIAGNOSIS — F419 Anxiety disorder, unspecified: Secondary | ICD-10-CM | POA: Diagnosis not present

## 2017-02-24 ENCOUNTER — Ambulatory Visit: Payer: BLUE CROSS/BLUE SHIELD | Admitting: Pain Medicine

## 2017-03-01 ENCOUNTER — Ambulatory Visit (HOSPITAL_BASED_OUTPATIENT_CLINIC_OR_DEPARTMENT_OTHER): Payer: BLUE CROSS/BLUE SHIELD | Admitting: Pain Medicine

## 2017-03-01 ENCOUNTER — Ambulatory Visit
Admission: RE | Admit: 2017-03-01 | Discharge: 2017-03-01 | Disposition: A | Discharge status: Home / Self Care | Payer: BLUE CROSS/BLUE SHIELD | Source: Ambulatory Visit | Attending: Pain Medicine | Admitting: Pain Medicine

## 2017-03-01 ENCOUNTER — Encounter: Payer: Self-pay | Admitting: Pain Medicine

## 2017-03-01 VITALS — BP 151/112 | HR 71 | Temp 97.9°F | Resp 16 | Ht 68.0 in | Wt 187.0 lb

## 2017-03-01 DIAGNOSIS — R51 Headache: Secondary | ICD-10-CM

## 2017-03-01 DIAGNOSIS — R519 Headache, unspecified: Secondary | ICD-10-CM

## 2017-03-01 DIAGNOSIS — M542 Cervicalgia: Secondary | ICD-10-CM | POA: Insufficient documentation

## 2017-03-01 DIAGNOSIS — G4486 Cervicogenic headache: Secondary | ICD-10-CM

## 2017-03-01 DIAGNOSIS — G8929 Other chronic pain: Secondary | ICD-10-CM | POA: Diagnosis not present

## 2017-03-01 MED ORDER — LIDOCAINE HCL 2 % IJ SOLN
10.0000 mL | Freq: Once | INTRAMUSCULAR | Status: AC
  Administered 2017-03-01: 400 mg
  Filled 2017-03-01: qty 40

## 2017-03-01 MED ORDER — DEXAMETHASONE SODIUM PHOSPHATE 10 MG/ML IJ SOLN
10.0000 mg | Freq: Once | INTRAMUSCULAR | Status: AC
Start: 2017-03-01 — End: 2017-03-01
  Administered 2017-03-01: 10 mg
  Filled 2017-03-01: qty 1

## 2017-03-01 MED ORDER — ROPIVACAINE HCL 2 MG/ML IJ SOLN
1.0000 mL | Freq: Once | INTRAMUSCULAR | Status: AC
  Administered 2017-03-01: 10 mL via EPIDURAL
  Filled 2017-03-01: qty 10

## 2017-03-01 NOTE — Progress Notes (Signed)
Patient's Name: Maxwell Richards  MRN: [id number]  Referring Provider: Shirlean Mylar, MD  DOB: Aug 03, 1970  PCP: Shirlean Mylar, MD  DOS: 03/01/2017  Note by: Oswaldo Done, MD  Service setting: Ambulatory outpatient  Specialty: Interventional Pain Management  Patient type: Established  Location: [facility] (AMB) Pain Management Facility  Visit type: Interventional Procedure   Primary Reason for Visit: Interventional Pain Management Treatment. CC: Neck Pain (left)  Procedure:  Anesthesia, Analgesia, Anxiolysis:  Type: Diagnostic, Greater, Occipital Nerve Block Region: Posterolateral Cervical Level: Occipital Ridge   Laterality: Left-Sided  Type: Local Anesthesia Local Anesthetic: Lidocaine 1% Route: Infiltration ([place]/IM) IV Access: Declined Sedation: Declined  Indication(s): Analgesia           Indications: 1. Occipital headache (Left)   2. Chronic head pain (Primary Area of Pain) (Left)   3. Cervicogenic headache    Pain Score: Pre-procedure: 2 /10 Post-procedure: 0-No pain/10  Pre-op Assessment:  Maxwell Richards is a 46 y.o. (year old), male patient, seen today for interventional treatment. He  has a past surgical history that includes Nasal sinus surgery (2006). Maxwell Richards has a current medication list which includes the following prescription(s): amlodipine, hydrochlorothiazide, lorazepam, and paroxetine. His primarily concern today is the Neck Pain (left)  Initial Vital Signs: There were no vitals taken for this visit. BMI: Estimated body mass index is 28.43 kg/m as calculated from the following:   Height as of this encounter: 5\' 8"  (1.727 m).   Weight as of this encounter: 187 lb (84.8 kg).  Risk Assessment: Allergies: Reviewed. He is allergic to cyclobenzaprine; other; and robaxin [methocarbamol].  Allergy Precautions: None required Coagulopathies: Reviewed. None identified.  Blood-thinner therapy: None at this time Active Infection(s): Reviewed. None identified. Maxwell Richards is  afebrile  Site Confirmation: Maxwell Richards was asked to confirm the procedure and laterality before marking the site Procedure checklist: Completed Consent: Before the procedure and under the influence of no sedative(s), amnesic(s), or anxiolytics, the patient was informed of the treatment options, risks and possible complications. To fulfill our ethical and legal obligations, as recommended by the American Medical Association's Code of Ethics, I have informed the patient of my clinical impression; the nature and purpose of the treatment or procedure; the risks, benefits, and possible complications of the intervention; the alternatives, including doing nothing; the risk(s) and benefit(s) of the alternative treatment(s) or procedure(s); and the risk(s) and benefit(s) of doing nothing. The patient was provided information about the general risks and possible complications associated with the procedure. These may include, but are not limited to: failure to achieve desired goals, infection, bleeding, organ or nerve damage, allergic reactions, paralysis, and death. In addition, the patient was informed of those risks and complications associated to the procedure, such as failure to decrease pain; infection; bleeding; organ or nerve damage with subsequent damage to sensory, motor, and/or autonomic systems, resulting in permanent pain, numbness, and/or weakness of one or several areas of the body; allergic reactions; (i.e.: anaphylactic reaction); and/or death. Furthermore, the patient was informed of those risks and complications associated with the medications. These include, but are not limited to: allergic reactions (i.e.: anaphylactic or anaphylactoid reaction(s)); adrenal axis suppression; blood sugar elevation that in diabetics may result in ketoacidosis or comma; water retention that in patients with history of congestive heart failure may result in shortness of breath, pulmonary edema, and decompensation with  resultant heart failure; weight gain; swelling or edema; medication-induced neural toxicity; particulate matter embolism and blood vessel occlusion with resultant organ, and/or  nervous system infarction; and/or aseptic necrosis of one or more joints. Finally, the patient was informed that Medicine is not an exact science; therefore, there is also the possibility of unforeseen or unpredictable risks and/or possible complications that may result in a catastrophic outcome. The patient indicated having understood very clearly. We have given the patient no guarantees and we have made no promises. Enough time was given to the patient to ask questions, all of which were answered to the patient's satisfaction. Maxwell Richards has indicated that he wanted to continue with the procedure. Attestation: I, the ordering provider, attest that I have discussed with the patient the benefits, risks, side-effects, alternatives, likelihood of achieving goals, and potential problems during recovery for the procedure that I have provided informed consent. Date: 03/01/2017; Time: 7:27 AM  Pre-Procedure Preparation:  Monitoring: As per clinic protocol. Respiration, ETCO2, SpO2, BP, heart rate and rhythm monitor placed and checked for adequate function Safety Precautions: Patient was assessed for positional comfort and pressure points before starting the procedure. Time-out: I initiated and conducted the "Time-out" before starting the procedure, as per protocol. The patient was asked to participate by confirming the accuracy of the "Time Out" information. Verification of the correct person, site, and procedure were performed and confirmed by me, the nursing staff, and the patient. "Time-out" conducted as per Joint Commission's Universal Protocol (UP.01.01.01). "Time-out" Date & Time: 03/01/2017; 0931 hrs.  Description of Procedure Process:   Position: Prone Target Area: Area medial to the occipital artery at the level of the superior  nuchal ridge Approach: Posterior approach Area Prepped: Entire Posterior Occipital Region Prepping solution: ChloraPrep (2% chlorhexidine gluconate and 70% isopropyl alcohol) Safety Precautions: Aspiration looking for blood return was conducted prior to all injections. At no point did we inject any substances, as a needle was being advanced. No attempts were made at seeking any paresthesias. Safe injection practices and needle disposal techniques used. Medications properly checked for expiration dates. SDV (single dose vial) medications used. Description of the Procedure: Protocol guidelines were followed. The target area was identified and the area prepped in the usual manner. Skin & deeper tissues infiltrated with local anesthetic. Appropriate amount of time allowed to pass for local anesthetics to take effect. The procedure needles were then advanced to the target area. Proper needle placement secured. Negative aspiration confirmed. Solution injected in intermittent fashion, asking for systemic symptoms every 0.5cc of injectate. The needles were then removed and the area cleansed, making sure to leave some of the prepping solution back to take advantage of its long term bactericidal properties. Vitals:   03/01/17 0934 03/01/17 0939 03/01/17 0941 03/01/17 0947  BP: (!) 155/103 (!) 150/97 (!) 151/100 (!) 151/112  Pulse:      Resp: 14 16 16 16   Temp:      TempSrc:      SpO2: 97% 95% 94%   Weight:      Height:        Start Time: 0932 hrs. End Time: 0940 hrs. Materials:  Needle(s) Type: Regular needle Gauge: 22G Length: 3.5-in Medication(s): We administered dexamethasone, ropivacaine (PF) 2 mg/mL (0.2%), and lidocaine. Please see chart orders for dosing details.  Imaging Guidance (Non-Spinal):  Type of Imaging Technique: Fluoroscopy Guidance (Non-Spinal) Indication(s): Assistance in needle guidance and placement for procedures requiring needle placement in or near specific anatomical  locations not easily accessible without such assistance. Exposure Time: Please see nurses notes. Contrast: None used. Fluoroscopic Guidance: I was personally present during the use of fluoroscopy. "Tunnel  Vision Technique" used to obtain the best possible view of the target area. Parallax error corrected before commencing the procedure. "Direction-depth-direction" technique used to introduce the needle under continuous pulsed fluoroscopy. Once target was reached, antero-posterior, oblique, and lateral fluoroscopic projection used confirm needle placement in all planes. Images permanently stored in EMR. Interpretation: No contrast injected. I personally interpreted the imaging intraoperatively. Adequate needle placement confirmed in multiple planes. Permanent images saved into the patient's record.  Antibiotic Prophylaxis:  Indication(s): None identified Antibiotic given: None  Post-operative Assessment:  EBL: None Complications: No immediate post-treatment complications observed by team, or reported by patient. Note: The patient tolerated the entire procedure well. A repeat set of vitals were taken after the procedure and the patient was kept under observation following institutional policy, for this type of procedure. Post-procedural neurological assessment was performed, showing return to baseline, prior to discharge. The patient was provided with post-procedure discharge instructions, including a section on how to identify potential problems. Should any problems arise concerning this procedure, the patient was given instructions to immediately contact us, at any time, without hesitation. In any case, we plan to contact the patient by telephone for a follow-up status report regarding this interventional procedure. Comments:  No additional relevant information.  Plan of Care    Imaging Orders     DG C-Arm 1-60 Min-No Report  Procedure Orders     GREATER OCCIPITAL NERVE BLOCK  Medications  ordered for procedure: Meds ordered this encounter  Medications  . dexamethasone (DECADRON) injection 10 mg  . ropivacaine (PF) 2 mg/mL (0.2%) (NAROPIN) injection 1 mL  . lidocaine (XYLOCAINE) 2 % (with pres) injection 200 mg   Medications administered: We administered dexamethasone, ropivacaine (PF) 2 mg/mL (0.2%), and lidocaine.  See the medical record for exact dosing, route, and time of administration.  New Prescriptions   No medications on file   Disposition: Discharge home  Discharge Date & Time: 03/01/2017; 0947 hrs.   Physician-requested Follow-up: Return for post-procedure eval by Dr. Laban Emperor in 2 wks. Future Appointments Date Time Provider Department Center  03/16/2017 2:00 PM Delano Metz, MD [facility] None   Primary Care Physician: Shirlean Mylar, MD Location: [facility] Out[facility] Note by: Oswaldo Done, MD Date: 03/01/2017; Time: 10:47 AM  Disclaimer:  Medicine is not an exact science. The only guarantee in medicine is that nothing is guaranteed. It is important to note that the decision to proceed with this intervention was based on the information collected from the patient. The Data and conclusions were drawn from the patient's questionnaire, the interview, and the physical examination. Because the information was provided in large part by the patient, it cannot be guaranteed that it has not been purposely or unconsciously manipulated. Every effort has been made to obtain as much relevant data as possible for this evaluation. It is important to note that the conclusions that lead to this procedure are derived in large part from the available data. Always take into account that the treatment will also be dependent on availability of resources and existing treatment guidelines, considered by other Pain Management Practitioners as being common knowledge and practice, at the time of the intervention. For Medico-Legal purposes, it is also important  to point out that variation in procedural techniques and pharmacological choices are the acceptable norm. The indications, contraindications, technique, and results of the above procedure should only be interpreted and judged by a Board-Certified Interventional Pain Specialist with extensive familiarity and expertise in the same exact procedure and  technique.

## 2017-03-01 NOTE — Patient Instructions (Signed)

## 2017-03-01 NOTE — Progress Notes (Signed)
Safety precautions to be maintained throughout the outpatient stay will include: orient to surroundings, keep bed in low position, maintain call bell within reach at all times, provide assistance with transfer out of bed and ambulation.  

## 2017-03-02 ENCOUNTER — Telehealth: Payer: Self-pay

## 2017-03-02 NOTE — Telephone Encounter (Signed)
Post procedue phone call.  Patient state she is doing good.   

## 2017-03-15 NOTE — Progress Notes (Signed)
Patient's Name: Maxwell Richards  MRN: [id number]  Referring Provider: Maurice Small, MD  DOB: Apr 10, 1971  PCP: Maurice Small, MD  DOS: 03/16/2017  Note by: Gaspar Cola, MD  Service setting: Ambulatory outpatient  Specialty: Interventional Pain Management  Location: [facility] (AMB) Pain Management Facility    Patient type: Established   Primary Reason(s) for Visit: Encounter for post-procedure evaluation of chronic illness with mild to moderate exacerbation CC: Neck Pain  HPI  [name] is a 46 y.o. year old, male patient, who comes today for a post-procedure evaluation. He has Long term prescription benzodiazepine use; Chronic pain syndrome; Chronic neck pain (Secondary Area of pain) (Bilateral) (L>R); Chronic head pain (Primary Area of Pain) (Left); Chronic shoulder pain [facility] Area of pain) (Bilateral) (L>R); Cervicogenic headache; Occipital headache (Left); Radicular pain of shoulder; Disorder of skeletal system; Pharmacologic therapy; Problems influencing health status; and Occipital neuralgia (Left) on their problem list. His primarily concern today is the Neck Pain  Pain Assessment: Location:   Neck Radiating: to back of head Onset: More than a month ago Duration: Chronic pain Quality: Tightness Severity: 2 /10 (self-reported pain score)  Note: Reported level is compatible with observation.                               Timing: Constant  [name] comes in today for post-procedure evaluation after the treatment done on 03/01/2017.  Further details on both, my assessment(s), as well as the proposed treatment plan, please see below.  Post-Procedure Assessment  03/01/2017 Procedure: Diagnostic left-sided greater occipital nerve block under fluoroscopic guidance, no sedation. Pre-procedure pain score:  2/10 Post-procedure pain score: 0/10 (100% relief) Influential Factors: BMI: 28.89 kg/m Intra-procedural challenges: None observed.         Assessment challenges: None detected.               Reported side-effects: None.        Post-procedural adverse reactions or complications: None reported         Sedation: No sedation used. When no sedatives are used, the analgesic levels obtained are directly associated to the effectiveness of the local anesthetics. However, when sedation is provided, the level of analgesia obtained during the initial 1 hour following the intervention, is believed to be the result of a combination of factors. These factors may include, but are not limited to: 1. The effectiveness of the local anesthetics used. 2. The effects of the analgesic(s) and/or anxiolytic(s) used. 3. The degree of discomfort experienced by the patient at the time of the procedure. 4. The patients ability and reliability in recalling and recording the events. 5. The presence and influence of possible secondary gains and/or psychosocial factors. Reported result: Relief experienced during the 1st hour after the procedure: 50 % (Ultra-Short Term Relief) Based on the patient's description, we did not get the pain in the area supplied by the TON (third occipital nerve). Interpretative annotation: Clinically appropriate result. No IV Analgesic or Anxiolytic given, therefore benefits are completely due to Local Anesthetic effects. Partial relief from local anesthetics would suggest that treated area is not 100% responsible for the patient's symptoms.  Effects of local anesthetic: The analgesic effects attained during this period are directly associated to the localized infiltration of local anesthetics and therefore cary significant diagnostic value as to the etiological location, or anatomical origin, of the pain. Expected duration of relief is directly dependent on the pharmacodynamics of the local  anesthetic used. Long-acting (4-6 hours) anesthetics used.  Reported result: Relief during the next 4 to 6 hour after the procedure: 50 % (Short-Term Relief)            Interpretative annotation:  Clinically appropriate result. Analgesia during this period is likely to be Local Anesthetic-related. Partial relief from local anesthetics would suggest that treated area is not 100% responsible for the patient's symptoms.  Long-term benefit: Defined as the period of time past the expected duration of local anesthetics (1 hour for short-acting and 4-6 hours for long-acting). With the possible exception of prolonged sympathetic blockade from the local anesthetics, benefits during this period are typically attributed to, or associated with, other factors such as analgesic sensory neuropraxia, antiinflammatory effects, or beneficial biochemical changes provided by agents other than the local anesthetics.  Reported result: Extended relief following procedure: 0 % (Long-Term Relief)            Interpretative annotation: Clinically possible results. Recurrence of symptoms. No permanent benefit expected. No significant inflammatory component detected.          Current benefits: Defined as reported results that persistent at this point in time.   Analgesia: 0 %            Function: No improvement ROM: No improvement Interpretative annotation: Recurrence of symptoms. No permanent benefit expected. Effective diagnostic intervention.          Interpretation: Results would suggest a successful diagnostic intervention. Based on these results, it is clear that there is also involvement of the third occipital nerve.          Plan:  The plan is to do a C2 + TON nerve block on the left side, under fluoroscopic guidance.  If this provides patient with 100% relief of the pain, then we will move on to doing a C2 + TON RFA.        Laboratory Chemistry  Inflammation Markers (CRP: Acute Phase) (ESR: Chronic Phase) Lab Results  Component Value Date   CRP 2.5 11/23/2016   ESRSEDRATE 7 11/23/2016                 Renal Function Markers Lab Results  Component Value Date   BUN 11 11/23/2016   CREATININE 0.85  11/23/2016   GFRAA 120 11/23/2016   GFRNONAA 104 11/23/2016                 Hepatic Function Markers Lab Results  Component Value Date   AST 52 (H) 11/23/2016   ALT 67 (H) 11/23/2016   ALBUMIN 4.4 11/23/2016   ALKPHOS 88 11/23/2016                 Electrolytes Lab Results  Component Value Date   NA 142 11/23/2016   K 4.0 11/23/2016   CL 101 11/23/2016   CALCIUM 9.9 11/23/2016   MG 2.2 11/23/2016                 Neuropathy Markers Lab Results  Component Value Date   VITAMINB12 1,551 (H) 11/23/2016                 Bone Pathology Markers Lab Results  Component Value Date   ALKPHOS 88 11/23/2016   25OHVITD1 22 (L) 11/23/2016   25OHVITD2 <1.0 11/23/2016   25OHVITD3 22 11/23/2016   CALCIUM 9.9 11/23/2016                 Coagulation Parameters Lab Results  Component Value Date  PLT 269 03/21/2008                 Cardiovascular Markers Lab Results  Component Value Date   HGB 16.0 03/21/2008   HCT 47.0 03/21/2008                 Note: Lab results reviewed.  Recent Diagnostic Imaging Results  DG C-Arm 1-60 Min-No Report Fluoroscopy was utilized by the requesting physician.  No radiographic  interpretation.   Complexity Note: Imaging results reviewed. Results shared with Mr. Wixom, using Layman's terms.                         Meds   Current Outpatient Medications:  .  amLODipine (NORVASC) 5 MG tablet, Take 5 mg by mouth daily., Disp: , Rfl:  .  hydrochlorothiazide (MICROZIDE) 12.5 MG capsule, Take 12.5 mg by mouth daily., Disp: , Rfl: 4 .  LORazepam (ATIVAN) 1 MG tablet, Take 1 mg by mouth as needed for anxiety., Disp: , Rfl:  .  PARoxetine (PAXIL) 10 MG tablet, Take 10 mg by mouth daily., Disp: , Rfl:   ROS  Constitutional: Denies any fever or chills Gastrointestinal: No reported hemesis, hematochezia, vomiting, or acute GI distress Musculoskeletal: Denies any acute onset joint swelling, redness, loss of ROM, or weakness Neurological: No reported  episodes of acute onset apraxia, aphasia, dysarthria, agnosia, amnesia, paralysis, loss of coordination, or loss of consciousness  Allergies  Mr. Dagher is allergic to cyclobenzaprine; other; and robaxin [methocarbamol].  [place]  Drug: Mr. Taniguchi  reports that he does not use drugs. Alcohol:  reports that he drinks alcohol. Tobacco:  reports that  has never smoked. he has never used smokeless tobacco. Medical:  has a past medical history of Angina at rest [facility]) and Hypertension. Surgical: Mr. Venditto  has a past surgical history that includes Nasal sinus surgery (2006). Family: family history is not on file.  Constitutional Exam  General appearance: Well nourished, well developed, and well hydrated. In no apparent acute distress Vitals:   03/16/17 1421 03/16/17 1422  BP:  134/85  Pulse: 67   Resp: 16   Temp: 98.4 F (36.9 C)   SpO2: 100%   Weight: 190 lb (86.2 kg)   Height: 5' 8" (1.727 m)    BMI Assessment: Estimated body mass index is 28.89 kg/m as calculated from the following:   Height as of this encounter: 5' 8" (1.727 m).   Weight as of this encounter: 190 lb (86.2 kg).  BMI interpretation table: BMI level Category Range association with higher incidence of chronic pain  <18 kg/m2 Underweight   18.5-24.9 kg/m2 Ideal body weight   25-29.9 kg/m2 Overweight Increased incidence by 20%  30-34.9 kg/m2 Obese (Class I) Increased incidence by 68%  35-39.9 kg/m2 Severe obesity (Class II) Increased incidence by 136%  >40 kg/m2 Extreme obesity (Class III) Increased incidence by 254%   BMI Readings from Last 4 Encounters:  03/16/17 28.89 kg/m  03/01/17 28.43 kg/m  01/24/17 28.89 kg/m  11/23/16 29.19 kg/m   Wt Readings from Last 4 Encounters:  03/16/17 190 lb (86.2 kg)  03/01/17 187 lb (84.8 kg)  01/24/17 190 lb (86.2 kg)  11/23/16 192 lb (87.1 kg)  Psych/Mental status: Alert, oriented x 3 (person, place, & time)       Eyes: PERLA Respiratory: No evidence of acute respiratory  distress  Cervical Spine Area Exam  Skin & Axial Inspection: No masses, redness, edema, swelling, or  associated skin lesions Alignment: Symmetrical Functional ROM: Unrestricted ROM      Stability: No instability detected Muscle Tone/Strength: Functionally intact. No obvious neuro-muscular anomalies detected. Sensory (Neurological): Unimpaired Palpation: No palpable anomalies              Upper Extremity (UE) Exam    Side: Right upper extremity  Side: Left upper extremity  Skin & Extremity Inspection: Skin color, temperature, and hair growth are WNL. No peripheral edema or cyanosis. No masses, redness, swelling, asymmetry, or associated skin lesions. No contractures.  Skin & Extremity Inspection: Skin color, temperature, and hair growth are WNL. No peripheral edema or cyanosis. No masses, redness, swelling, asymmetry, or associated skin lesions. No contractures.  Functional ROM: Unrestricted ROM          Functional ROM: Unrestricted ROM          Muscle Tone/Strength: Functionally intact. No obvious neuro-muscular anomalies detected.  Muscle Tone/Strength: Functionally intact. No obvious neuro-muscular anomalies detected.  Sensory (Neurological): Unimpaired          Sensory (Neurological): Unimpaired          Palpation: No palpable anomalies              Palpation: No palpable anomalies              Specialized Test(s): Deferred         Specialized Test(s): Deferred          Thoracic Spine Area Exam  Skin & Axial Inspection: No masses, redness, or swelling Alignment: Symmetrical Functional ROM: Unrestricted ROM Stability: No instability detected Muscle Tone/Strength: Functionally intact. No obvious neuro-muscular anomalies detected. Sensory (Neurological): Unimpaired Muscle strength & Tone: No palpable anomalies  Lumbar Spine Area Exam  Skin & Axial Inspection: No masses, redness, or swelling Alignment: Symmetrical Functional ROM: Unrestricted ROM      Stability: No instability  detected Muscle Tone/Strength: Functionally intact. No obvious neuro-muscular anomalies detected. Sensory (Neurological): Unimpaired Palpation: No palpable anomalies       Provocative Tests: Lumbar Hyperextension and rotation test: evaluation deferred today       Lumbar Lateral bending test: evaluation deferred today       Patrick's Maneuver: evaluation deferred today                    Gait & Posture Assessment  Ambulation: Unassisted Gait: Relatively normal for age and body habitus Posture: WNL   Lower Extremity Exam    Side: Right lower extremity  Side: Left lower extremity  Skin & Extremity Inspection: Skin color, temperature, and hair growth are WNL. No peripheral edema or cyanosis. No masses, redness, swelling, asymmetry, or associated skin lesions. No contractures.  Skin & Extremity Inspection: Skin color, temperature, and hair growth are WNL. No peripheral edema or cyanosis. No masses, redness, swelling, asymmetry, or associated skin lesions. No contractures.  Functional ROM: Unrestricted ROM          Functional ROM: Unrestricted ROM          Muscle Tone/Strength: Functionally intact. No obvious neuro-muscular anomalies detected.  Muscle Tone/Strength: Functionally intact. No obvious neuro-muscular anomalies detected.  Sensory (Neurological): Unimpaired  Sensory (Neurological): Unimpaired  Palpation: No palpable anomalies  Palpation: No palpable anomalies   Assessment  Primary Diagnosis & Pertinent Problem List: The primary encounter diagnosis was Occipital neuralgia (Left). Diagnoses of Cervicogenic headache and Chronic head pain (Primary Area of Pain) (Left) were also pertinent to this visit.  Status Diagnosis  Controlled Controlled Controlled 1. Occipital  neuralgia (Left)   2. Cervicogenic headache   3. Chronic head pain (Primary Area of Pain) (Left)     Problems updated and reviewed during this visit: Problem  Occipital neuralgia (Left)   Plan of Care   Pharmacotherapy (Medications Ordered): No orders of the defined types were placed in this encounter. This SmartLink is deprecated. Use AVSMEDLIST instead to display the medication list for a patient. Medications administered today: Legrand Ritthaler had no medications administered during this visit.   Procedure Orders     SELECTIVE NERVE ROOT Lab Orders  No laboratory test(s) ordered today   Imaging Orders  No imaging studies ordered today   Referral Orders  No referral(s) requested today    Interventional management options: Planned, scheduled, and/or pending:   Diagnostic left-sided C2 + TON nerve block #1 under fluoroscopic guidance, no sedation.   Considering:   Diagnostic left sided greater occipital nerve block  Possible left-sided greater occipital nerve RFA Diagnostic left-sided C2 + TON nerve block Possible left-sided C2 + TON RFA  Possible diagnostic left sided occipital peripheral nerve stimulator trial  Diagnosticleft cervical epidural steroid injection Diagnosticleft sided cervical facet block Possible left-sided cervical facet RFA    Palliative PRN treatment(s):   None at this time   Provider-requested follow-up: Return for Procedure (no sedation): (L) C2 + TON BLK.  Future Appointments  Date Time Provider [place]  03/24/2017  7:45 AM Milinda Pointer, MD [facility] None   Primary Care Physician: Maurice Small, MD Location: [facility] Note by: Gaspar Cola, MD Date: 03/16/2017; Time: 4:14 PM

## 2017-03-16 ENCOUNTER — Encounter: Payer: Self-pay | Admitting: Pain Medicine

## 2017-03-16 ENCOUNTER — Ambulatory Visit: Payer: BLUE CROSS/BLUE SHIELD | Attending: Pain Medicine | Admitting: Pain Medicine

## 2017-03-16 VITALS — BP 134/85 | HR 67 | Temp 98.4°F | Resp 16 | Ht 68.0 in | Wt 190.0 lb

## 2017-03-16 DIAGNOSIS — R51 Headache: Secondary | ICD-10-CM | POA: Diagnosis not present

## 2017-03-16 DIAGNOSIS — Z888 Allergy status to other drugs, medicaments and biological substances status: Secondary | ICD-10-CM | POA: Insufficient documentation

## 2017-03-16 DIAGNOSIS — Z79899 Other long term (current) drug therapy: Secondary | ICD-10-CM | POA: Insufficient documentation

## 2017-03-16 DIAGNOSIS — M5481 Occipital neuralgia: Secondary | ICD-10-CM

## 2017-03-16 DIAGNOSIS — Z9889 Other specified postprocedural states: Secondary | ICD-10-CM | POA: Insufficient documentation

## 2017-03-16 DIAGNOSIS — G8929 Other chronic pain: Secondary | ICD-10-CM

## 2017-03-16 DIAGNOSIS — R519 Headache, unspecified: Secondary | ICD-10-CM

## 2017-03-16 DIAGNOSIS — G894 Chronic pain syndrome: Secondary | ICD-10-CM | POA: Insufficient documentation

## 2017-03-16 DIAGNOSIS — I10 Essential (primary) hypertension: Secondary | ICD-10-CM | POA: Insufficient documentation

## 2017-03-16 DIAGNOSIS — G4486 Cervicogenic headache: Secondary | ICD-10-CM

## 2017-03-16 NOTE — Patient Instructions (Addendum)
____________________________________________________________________________________________  Preparing for Procedure with Sedation Instructions: . Oral Intake: Do not eat or drink anything for at least 8 hours prior to your procedure. . Transportation: Public transportation is not allowed. Bring an adult driver. The driver must be physically present in our waiting room before any procedure can be started. Marland Kitchen. Physical Assistance: Bring an adult physically capable of assisting you, in the event you need help. This adult should keep you company at home for at least 6 hours after the procedure. . Blood Pressure Medicine: Take your blood pressure medicine with a sip of water the morning of the procedure. . Blood thinners:  . Diabetics on insulin: Notify the staff so that you can be scheduled 1st case in the morning. If your diabetes requires high dose insulin, take only  of your normal insulin dose the morning of the procedure and notify the staff that you have done so. . Preventing infections: Shower with an antibacterial soap the morning of your procedure. . Build-up your immune system: Take 1000 mg of Vitamin C with every meal (3 times a day) the day prior to your procedure. Marland Kitchen. Antibiotics: Inform the staff if you have a condition or reason that requires you to take antibiotics before dental procedures. . Pregnancy: If you are pregnant, call and cancel the procedure. . Sickness: If you have a cold, fever, or any active infections, call and cancel the procedure. . Arrival: You must be in the facility at least 30 minutes prior to your scheduled procedure. . Children: Do not bring children with you. . Dress appropriately: Bring dark clothing that you would not mind if they get stained. . Valuables: Do not bring any jewelry or valuables. Procedure appointments are reserved for interventional treatments only. Marland Kitchen. No Prescription Refills. . No medication changes will be discussed during procedure  appointments. . No disability issues will be discussed. ____________________________________________________________________________________________   Pain Management Discharge Instructions  General Discharge Instructions :  If you need to reach your doctor call: Monday-Friday 8:00 am - 4:00 pm at [phone] or toll free [phone].  After clinic hours [phone] to have operator reach doctor.  Bring all of your medication bottles to all your appointments in the pain clinic.  To cancel or reschedule your appointment with Pain Management please remember to call 24 hours in advance to avoid a fee.  Refer to the educational materials which you have been given on: General Risks, I had my Procedure. Discharge Instructions, Post Sedation.  Post Procedure Instructions:  The drugs you were given will stay in your system until tomorrow, so for the next 24 hours you should not drive, make any legal decisions or drink any alcoholic beverages.  You may eat anything you prefer, but it is better to start with liquids then soups and crackers, and gradually work up to solid foods.  Please notify your doctor immediately if you have any unusual bleeding, trouble breathing or pain that is not related to your normal pain.  Depending on the type of procedure that was done, some parts of your body may feel week and/or numb.  This usually clears up by tonight or the next day.  Walk with the use of an assistive device or accompanied by an adult for the 24 hours.  You may use ice on the affected area for the first 24 hours.  Put ice in a Ziploc bag and cover with a towel and place against area 15 minutes on 15 minutes off.  You may switch to heat  after 24 hours.Selective Nerve Root Block Patient Information  Description: Specific nerve roots exit the spinal canal and these nerves can be compressed and inflamed by a bulging disc and bone spurs.  By injecting steroids on the nerve root, we can  potentially decrease the inflammation surrounding these nerves, which often leads to decreased pain.  Also, by injecting local anesthesia on the nerve root, this can provide us helpful information to give to your referring doctor if it decreases your pain.  Selective nerve root blocks can be done along the spine from the neck to the low back depending on the location of your pain.   After numbing the skin with local anesthesia, a small needle is passed to the nerve root and the position of the needle is verified using x-ray pictures.  After the needle is in correct position, we then deposit the medication.  You may experience a pressure sensation while this is being done.  The entire block usually lasts less than 15 minutes.  Conditions that may be treated with selective nerve root blocks: Low back and leg pain Spinal stenosis Diagnostic block prior to potential surgery Neck and arm pain Post laminectomy syndrome  Preparation for the injection:  Do not eat any solid food or dairy products within 8 hours of your appointment. You may drink clear liquids up to 3 hours before an appointment.  Clear liquids include water, black coffee, juice or soda.  No milk or cream please. You may take your regular medications, including pain medications, with a sip of water before your appointment.  Diabetics should hold regular insulin (if taken separately) and take 1/2 normal NPH dose the morning of the procedure.  Carry some sugar containing items with you to your appointment. A driver must accompany you and be prepared to drive you home after your procedure. Bring all your current medications with you. An IV may be inserted and sedation may be given at the discretion of the physician. A blood pressure cuff, EKG, and other monitors will often be applied during the procedure.  Some patients may need to have extra oxygen administered for a short period. You will be asked to provide medical information, including  allergies, prior to the procedure.  We must know immediately if you are taking blood  Thinners (like Coumadin) or if you are allergic to IV iodine contrast (dye).  Possible side-effects: All are usually temporary Bleeding from needle site Light headedness Numbness and tingling Decreased blood pressure Weakness in arms/legs Pressure sensation in back/neck Pain at injection site (several days)  Possible complications: All are extremely rare Infection Nerve injury Spinal headache (a headache wore with upright position)  Call if you experience: Fever/chills associated with headache or increased back/neck pain Headache worsened by an upright position New onset weakness or numbness of an extremity below the injection site Hives or difficulty breathing (go to the emergency room) Inflammation or drainage at the injection site(s) Severe back/neck pain greater than usual New symptoms which are concerning to you  Please note:  Although the local anesthetic injected can often make your back or neck feel good for several hours after the injection the pain will likely return.  It takes 3-5 days for steroids to work on the nerve root. You may not notice any pain relief for at least one week.  If effective, we will often do a series of 3 injections spaced 3-6 weeks apart to maximally decrease your pain.    If you have any questions, please  call [phone] [facility]

## 2017-03-24 ENCOUNTER — Encounter: Payer: Self-pay | Admitting: Pain Medicine

## 2017-03-24 ENCOUNTER — Ambulatory Visit
Admission: RE | Admit: 2017-03-24 | Discharge: 2017-03-24 | Disposition: A | Discharge status: Home / Self Care | Payer: BLUE CROSS/BLUE SHIELD | Source: Ambulatory Visit | Attending: Pain Medicine | Admitting: Pain Medicine

## 2017-03-24 ENCOUNTER — Other Ambulatory Visit: Payer: Self-pay

## 2017-03-24 ENCOUNTER — Ambulatory Visit (HOSPITAL_BASED_OUTPATIENT_CLINIC_OR_DEPARTMENT_OTHER): Payer: BLUE CROSS/BLUE SHIELD | Admitting: Pain Medicine

## 2017-03-24 VITALS — BP 126/90 | HR 69 | Temp 98.1°F | Resp 17 | Ht 68.0 in | Wt 190.0 lb

## 2017-03-24 DIAGNOSIS — G8929 Other chronic pain: Secondary | ICD-10-CM | POA: Insufficient documentation

## 2017-03-24 DIAGNOSIS — M5481 Occipital neuralgia: Secondary | ICD-10-CM | POA: Insufficient documentation

## 2017-03-24 DIAGNOSIS — R51 Headache: Secondary | ICD-10-CM | POA: Insufficient documentation

## 2017-03-24 DIAGNOSIS — G4486 Cervicogenic headache: Secondary | ICD-10-CM

## 2017-03-24 DIAGNOSIS — R519 Headache, unspecified: Secondary | ICD-10-CM

## 2017-03-24 MED ORDER — DEXAMETHASONE SODIUM PHOSPHATE 10 MG/ML IJ SOLN
INTRAMUSCULAR | Status: AC
Start: 2017-03-24 — End: 2017-03-24
  Filled 2017-03-24: qty 1

## 2017-03-24 MED ORDER — ROPIVACAINE HCL 2 MG/ML IJ SOLN: 1.0000 mL | Freq: Once | INTRAMUSCULAR | Status: DC

## 2017-03-24 MED ORDER — DEXAMETHASONE SODIUM PHOSPHATE 10 MG/ML IJ SOLN
10.0000 mg | Freq: Once | INTRAMUSCULAR | Status: AC
  Administered 2017-03-24: 10 mg
  Filled 2017-03-24: qty 1

## 2017-03-24 MED ORDER — ROPIVACAINE HCL 2 MG/ML IJ SOLN
1.0000 mL | Freq: Once | INTRAMUSCULAR | Status: DC
  Filled 2017-03-24: qty 10

## 2017-03-24 MED ORDER — LIDOCAINE HCL 2 % IJ SOLN
10.0000 mL | Freq: Once | INTRAMUSCULAR | Status: AC
  Administered 2017-03-24: 400 mg
  Filled 2017-03-24: qty 40

## 2017-03-24 MED ORDER — IOPAMIDOL (ISOVUE-M 200) INJECTION 41%
10.0000 mL | Freq: Once | INTRAMUSCULAR | Status: AC
  Administered 2017-03-24: 10 mL via EPIDURAL
  Filled 2017-03-24: qty 10

## 2017-03-24 MED ORDER — DEXAMETHASONE SODIUM PHOSPHATE 10 MG/ML IJ SOLN
10.0000 mg | Freq: Once | INTRAMUSCULAR | Status: AC
  Administered 2017-03-24: 10 mg

## 2017-03-24 NOTE — Progress Notes (Signed)
Patient's Name: Maxwell Richards  MRN: [id number]  Referring Provider: Delano MetzNaveira, Cyla Haluska, MD  DOB: 02/25/1971  PCP: Shirlean MylarWebb, Carol, MD  DOS: 03/24/2017  Note by: Oswaldo DoneFrancisco A Idania Desouza, MD  Service setting: Ambulatory outpatient  Specialty: Interventional Pain Management  Patient type: Established  Location: [facility] (AMB) Pain Management Facility  Visit type: Interventional Procedure   Primary Reason for Visit: Interventional Pain Management Treatment. CC: Headache and Neck Pain  Procedure:  Anesthesia, Analgesia, Anxiolysis:  Type: Diagnostic C2 + TON Nerve Block Region: Lateral Cervical Spine Level: C2 & C3 Level   Laterality: Left-Sided  Type: Local Anesthesia Local Anesthetic: Lidocaine 1% Route: Infiltration ([place]/IM) IV Access: Declined Sedation: Declined  Indication(s): Analgesia           Indications: 1. Occipital neuralgia (Left)   2. Occipital headache (Left)   3. Cervicogenic headache   4. Chronic head pain (Primary Area of Pain) (Left)   5. Occipital neuralgia of left side   6. Occipital headache   7. Chronic head pain    Pain Score: Pre-procedure: 2 /10 Post-procedure: 0-No pain( )/10  Pre-op Assessment:  Mr. Maxwell Richards is a 46 y.o. (year old), male patient, seen today for interventional treatment. He  has a past surgical history that includes Nasal sinus surgery (2006). Mr. Maxwell Richards has a current medication list which includes the following prescription(s): amlodipine, hydrochlorothiazide, lorazepam, and paroxetine, and the following Facility-Administered Medications: ropivacaine (pf) 2 mg/ml (0.2%) and ropivacaine (pf) 2 mg/ml (0.2%). His primarily concern today is the Headache and Neck Pain  Initial Vital Signs: There were no vitals taken for this visit. BMI: Estimated body mass index is 28.89 kg/m as calculated from the following:   Height as of this encounter: 5\' 8"  (1.727 m).   Weight as of this encounter: 190 lb (86.2 kg).  Risk Assessment: Allergies: Reviewed. He is allergic to  cyclobenzaprine; other; and robaxin [methocarbamol].  Allergy Precautions: None required Coagulopathies: Reviewed. None identified.  Blood-thinner therapy: None at this time Active Infection(s): Reviewed. None identified. Mr. Maxwell Richards is afebrile  Site Confirmation: Mr. Maxwell Richards was asked to confirm the procedure and laterality before marking the site Procedure checklist: Completed Consent: Before the procedure and under the influence of no sedative(s), amnesic(s), or anxiolytics, the patient was informed of the treatment options, risks and possible complications. To fulfill our ethical and legal obligations, as recommended by the American Medical Association's Code of Ethics, I have informed the patient of my clinical impression; the nature and purpose of the treatment or procedure; the risks, benefits, and possible complications of the intervention; the alternatives, including doing nothing; the risk(s) and benefit(s) of the alternative treatment(s) or procedure(s); and the risk(s) and benefit(s) of doing nothing. The patient was provided information about the general risks and possible complications associated with the procedure. These may include, but are not limited to: failure to achieve desired goals, infection, bleeding, organ or nerve damage, allergic reactions, paralysis, and death. In addition, the patient was informed of those risks and complications associated to the procedure, such as failure to decrease pain; infection; bleeding; organ or nerve damage with subsequent damage to sensory, motor, and/or autonomic systems, resulting in permanent pain, numbness, and/or weakness of one or several areas of the body; allergic reactions; (i.e.: anaphylactic reaction); and/or death. Furthermore, the patient was informed of those risks and complications associated with the medications. These include, but are not limited to: allergic reactions (i.e.: anaphylactic or anaphylactoid reaction(s)); adrenal axis  suppression; blood sugar elevation that in diabetics may result  in ketoacidosis or comma; water retention that in patients with history of congestive heart failure may result in shortness of breath, pulmonary edema, and decompensation with resultant heart failure; weight gain; swelling or edema; medication-induced neural toxicity; particulate matter embolism and blood vessel occlusion with resultant organ, and/or nervous system infarction; and/or aseptic necrosis of one or more joints. Finally, the patient was informed that Medicine is not an exact science; therefore, there is also the possibility of unforeseen or unpredictable risks and/or possible complications that may result in a catastrophic outcome. The patient indicated having understood very clearly. We have given the patient no guarantees and we have made no promises. Enough time was given to the patient to ask questions, all of which were answered to the patient's satisfaction. Mr. Maxwell Richards has indicated that he wanted to continue with the procedure. Attestation: I, the ordering provider, attest that I have discussed with the patient the benefits, risks, side-effects, alternatives, likelihood of achieving goals, and potential problems during recovery for the procedure that I have provided informed consent. Date: 03/24/2017; Time: 8:49 AM  Pre-Procedure Preparation:  Monitoring: As per clinic protocol. Respiration, ETCO2, SpO2, BP, heart rate and rhythm monitor placed and checked for adequate function Safety Precautions: Patient was assessed for positional comfort and pressure points before starting the procedure. Time-out: I initiated and conducted the "Time-out" before starting the procedure, as per protocol. The patient was asked to participate by confirming the accuracy of the "Time Out" information. Verification of the correct person, site, and procedure were performed and confirmed by me, the nursing staff, and the patient. "Time-out" conducted as  per Joint Commission's Universal Protocol (UP.01.01.01). "Time-out" Date & Time: 03/24/2017; 1057 hrs.  Description of Procedure Process:   Position: Lateral decubitus with painful side up Target Area: C2 + C3 cervical neural foramen Approach: Lateral approach Area Prepped: Anterior, posterior, and lateral aspect of neck, below the ear lobe. Prepping solution: ChloraPrep (2% chlorhexidine gluconate and 70% isopropyl alcohol) Safety Precautions: Aspiration looking for blood return was conducted prior to all injections. At no point did we inject any substances, as a needle was being advanced. No attempts were made at seeking any paresthesias. Safe injection practices and needle disposal techniques used. Medications properly checked for expiration dates. SDV (single dose vial) medications used. Description of the Procedure: Protocol guidelines were followed. The target area was identified and the area prepped in the usual manner. Skin & deeper tissues infiltrated with local anesthetic. Appropriate amount of time allowed to pass for local anesthetics to take effect. The procedure needles were then advanced to the target area. Proper needle placement secured. Negative aspiration confirmed. Solution injected in intermittent fashion, asking for systemic symptoms every 0.5cc of injectate. The needles were then removed and the area cleansed, making sure to leave some of the prepping solution back to take advantage of its long term bactericidal properties.   Vitals:   03/24/17 1054 03/24/17 1100 03/24/17 1105 03/24/17 1110  BP: (!) 136/92 134/82 133/87 126/90  Pulse:      Resp: 17 16 16 17   Temp:      SpO2: 97% 97% 97% 97%  Weight:      Height:        Start Time: 1057 hrs. End Time: 1109 hrs. Materials:  Needle(s) Type: Regular needle Gauge: 25G Length: 3.5-in Medication(s): We administered dexamethasone, lidocaine, iopamidol, and dexamethasone. Please see chart orders for dosing  details.  Imaging Guidance (Non-Spinal):  Type of Imaging Technique: Fluoroscopy Guidance (Non-Spinal) Indication(s): Assistance in  needle guidance and placement for procedures requiring needle placement in or near specific anatomical locations not easily accessible without such assistance. Exposure Time: Please see nurses notes. Contrast: Before injecting any contrast, we confirmed that the patient did not have an allergy to iodine, shellfish, or radiological contrast. Once satisfactory needle placement was completed at the desired level, radiological contrast was injected. Contrast injected under live fluoroscopy. No contrast complications. See chart for type and volume of contrast used. Fluoroscopic Guidance: I was personally present during the use of fluoroscopy. "Tunnel Vision Technique" used to obtain the best possible view of the target area. Parallax error corrected before commencing the procedure. "Direction-depth-direction" technique used to introduce the needle under continuous pulsed fluoroscopy. Once target was reached, antero-posterior, oblique, and lateral fluoroscopic projection used confirm needle placement in all planes. Images permanently stored in EMR. Interpretation: I personally interpreted the imaging intraoperatively. Adequate needle placement confirmed in multiple planes. Appropriate spread of contrast into desired area was observed. No evidence of afferent or efferent intravascular uptake. Permanent images saved into the patient's record.  Antibiotic Prophylaxis:  Indication(s): None identified Antibiotic given: None  Post-operative Assessment:  EBL: None Complications: No immediate post-treatment complications observed by team, or reported by patient. Note: The patient tolerated the entire procedure well. A repeat set of vitals were taken after the procedure and the patient was kept under observation following institutional policy, for this type of procedure.  Post-procedural neurological assessment was performed, showing return to baseline, prior to discharge. The patient was provided with post-procedure discharge instructions, including a section on how to identify potential problems. Should any problems arise concerning this procedure, the patient was given instructions to immediately contact us, at any time, without hesitation. In any case, we plan to contact the patient by telephone for a follow-up status report regarding this interventional procedure. Comments:  No additional relevant information.  Plan of Care    Imaging Orders     DG C-Arm 1-60 Min-No Report  Procedure Orders     SELECTIVE NERVE ROOT  Medications ordered for procedure: Meds ordered this encounter  Medications  . dexamethasone (DECADRON) injection 10 mg  . ropivacaine (PF) 2 mg/mL (0.2%) (NAROPIN) injection 1 mL  . lidocaine (XYLOCAINE) 2 % (with pres) injection 200 mg  . iopamidol (ISOVUE-M) 41 % intrathecal injection 10 mL  . dexamethasone (DECADRON) injection 10 mg  . ropivacaine (PF) 2 mg/mL (0.2%) (NAROPIN) injection 1 mL   Medications administered: We administered dexamethasone, lidocaine, iopamidol, and dexamethasone.  See the medical record for exact dosing, route, and time of administration.  This SmartLink is deprecated. Use AVSMEDLIST instead to display the medication list for a patient. Disposition: Discharge home  Discharge Date & Time: 03/24/2017; 1125 hrs.   Physician-requested Follow-up: Return for post-procedure eval by Dr. Laban EmperorNaveira in 2 wks. Future Appointments  Date Time Provider Department Center  04/13/2017  2:00 PM Delano MetzNaveira, Delaney Perona, MD [facility] None   Primary Care Physician: Shirlean MylarWebb, Carol, MD [facility] Note by: Oswaldo DoneFrancisco A Suhey Radford, MD Date: 03/24/2017; Time: 12:52 PM  Disclaimer:  Medicine is not an exact science. The only guarantee in medicine is that nothing is guaranteed. It is important to  note that the decision to proceed with this intervention was based on the information collected from the patient. The Data and conclusions were drawn from the patient's questionnaire, the interview, and the physical examination. Because the information was provided in large part by the patient, it cannot be guaranteed that it has  not been purposely or unconsciously manipulated. Every effort has been made to obtain as much relevant data as possible for this evaluation. It is important to note that the conclusions that lead to this procedure are derived in large part from the available data. Always take into account that the treatment will also be dependent on availability of resources and existing treatment guidelines, considered by other Pain Management Practitioners as being common knowledge and practice, at the time of the intervention. For Medico-Legal purposes, it is also important to point out that variation in procedural techniques and pharmacological choices are the acceptable norm. The indications, contraindications, technique, and results of the above procedure should only be interpreted and judged by a Board-Certified Interventional Pain Specialist with extensive familiarity and expertise in the same exact procedure and technique.

## 2017-03-24 NOTE — Patient Instructions (Addendum)
____________________________________________________________________________________________  Post-Procedure instructions Instructions:  Apply ice: Fill a plastic sandwich bag with crushed ice. Cover it with a small towel and apply to injection site. Apply for 15 minutes then remove x 15 minutes. Repeat sequence on day of procedure, until you go to bed. The purpose is to minimize swelling and discomfort after procedure.  Apply heat: Apply heat to procedure site starting the day following the procedure. The purpose is to treat any soreness and discomfort from the procedure.  Food intake: Start with clear liquids (like water) and advance to regular food, as tolerated.   Physical activities: Keep activities to a minimum for the first 8 hours after the procedure.   Driving: If you have received any sedation, you are not allowed to drive for 24 hours after your procedure.  Blood thinner: Restart your blood thinner 6 hours after your procedure. (Only for those taking blood thinners)  Insulin: As soon as you can eat, you may resume your normal dosing schedule. (Only for those taking insulin)  Infection prevention: Keep procedure site clean and dry.  Post-procedure Pain Diary: Extremely important that this be done correctly and accurately. Recorded information will be used to determine the next step in treatment.  Pain evaluated is that of treated area only. Do not include pain from an untreated area.  Complete every hour, on the hour, for the initial 8 hours. Set an alarm to help you do this part accurately.  Do not go to sleep and have it completed later. It will not be accurate.  Follow-up appointment: Keep your follow-up appointment after the procedure. Usually 2 weeks for most procedures. (6 weeks in the case of radiofrequency.) Bring you pain diary.  Expect:  From numbing medicine (AKA: Local Anesthetics): Numbness or decrease in pain.  Onset: Full effect within 15 minutes of  injected.  Duration: It will depend on the type of local anesthetic used. On the average, 1 to 8 hours.   From steroids: Decrease in swelling or inflammation. Once inflammation is improved, relief of the pain will follow.  Onset of benefits: Depends on the amount of swelling present. The more swelling, the longer it will take for the benefits to be seen. In some cases, up to 10 days.  Duration: Steroids will stay in the system x 2 weeks. Duration of benefits will depend on multiple posibilities including persistent irritating factors.  From procedure: Some discomfort is to be expected once the numbing medicine wears off. This should be minimal if ice and heat are applied as instructed. Call if:  You experience numbness and weakness that gets worse with time, as opposed to wearing off.  New onset bowel or bladder incontinence. (Spinal procedures only)  Emergency Numbers:  Durning business hours (Monday - Thursday, 8:00 AM - 4:00 PM) (Friday, 9:00 AM - 12:00 Noon): [phone]  After hours: [phone] ____________________________________________________________________________________________   Selective Nerve Root Block Patient Information  Description: Specific nerve roots exit the spinal canal and these nerves can be compressed and inflamed by a bulging disc and bone spurs.  By injecting steroids on the nerve root, we can potentially decrease the inflammation surrounding these nerves, which often leads to decreased pain.  Also, by injecting local anesthesia on the nerve root, this can provide us helpful information to give to your referring doctor if it decreases your pain.  Selective nerve root blocks can be done along the spine from the neck to the low back depending on the location of your pain.     After numbing the skin with local anesthesia, a small needle is passed to the nerve root and the position of the needle is verified using x-ray pictures.  After the needle is in  correct position, we then deposit the medication.  You may experience a pressure sensation while this is being done.  The entire block usually lasts less than 15 minutes.  Conditions that may be treated with selective nerve root blocks:  Low back and leg pain  Spinal stenosis  Diagnostic block prior to potential surgery  Neck and arm pain  Post laminectomy syndrome  Preparation for the injection:  1. Do not eat any solid food or dairy products within 8 hours of your appointment. 2. You may drink clear liquids up to 3 hours before an appointment.  Clear liquids include water, black coffee, juice or soda.  No milk or cream please. 3. You may take your regular medications, including pain medications, with a sip of water before your appointment.  Diabetics should hold regular insulin (if taken separately) and take 1/2 normal NPH dose the morning of the procedure.  Carry some sugar containing items with you to your appointment. 4. A driver must accompany you and be prepared to drive you home after your procedure. 5. Bring all your current medications with you. 6. An IV may be inserted and sedation may be given at the discretion of the physician. 7. A blood pressure cuff, EKG, and other monitors will often be applied during the procedure.  Some patients may need to have extra oxygen administered for a short period. 8. You will be asked to provide medical information, including allergies, prior to the procedure.  We must know immediately if you are taking blood  Thinners (like Coumadin) or if you are allergic to IV iodine contrast (dye).  Possible side-effects: All are usually temporary  Bleeding from needle site  Light headedness  Numbness and tingling  Decreased blood pressure  Weakness in arms/legs  Pressure sensation in back/neck  Pain at injection site (several days)  Possible complications: All are extremely rare  Infection  Nerve injury  Spinal headache (a headache  wore with upright position)  Call if you experience:  Fever/chills associated with headache or increased back/neck pain  Headache worsened by an upright position  New onset weakness or numbness of an extremity below the injection site  Hives or difficulty breathing (go to the emergency room)  Inflammation or drainage at the injection site(s)  Severe back/neck pain greater than usual  New symptoms which are concerning to you  Please note:  Although the local anesthetic injected can often make your back or neck feel good for several hours after the injection the pain will likely return.  It takes 3-5 days for steroids to work on the nerve root. You may not notice any pain relief for at least one week.  If effective, we will often do a series of 3 injections spaced 3-6 weeks apart to maximally decrease your pain.    If you have any questions, please call [phone] [facility]  General Discharge Instructions :  If you need to reach your doctor call: Monday-Friday 8:00 am - 4:00 pm at [phone] or toll free [phone].  After clinic hours [phone] to have operator reach doctor.  Bring all of your medication bottles to all your appointments in the pain clinic.  To cancel or reschedule your appointment with Pain Management please remember to call 24 hours in  advance to avoid a fee.  Refer to the educational materials which you have been given on: General Risks, I had my Procedure. Discharge Instructions, Post Sedation.  Post Procedure Instructions:  The drugs you were given will stay in your system until tomorrow, so for the next 24 hours you should not drive, make any legal decisions or drink any alcoholic beverages.  You may eat anything you prefer, but it is better to start with liquids then soups and crackers, and gradually work up to solid foods.  Please notify your doctor immediately if you  have any unusual bleeding, trouble breathing or pain that is not related to your normal pain.  Depending on the type of procedure that was done, some parts of your body may feel week and/or numb.  This usually clears up by tonight or the next day.  Walk with the use of an assistive device or accompanied by an adult for the 24 hours.  You may use ice on the affected area for the first 24 hours.  Put ice in a Ziploc bag and cover with a towel and place against area 15 minutes on 15 minutes off.  You may switch to heat after 24 hours.

## 2017-03-24 NOTE — Progress Notes (Signed)
Safety precautions to be maintained throughout the outpatient stay will include: orient to surroundings, keep bed in low position, maintain call bell within reach at all times, provide assistance with transfer out of bed and ambulation.  

## 2017-03-25 ENCOUNTER — Telehealth: Payer: Self-pay

## 2017-03-25 NOTE — Telephone Encounter (Signed)
Post procedure phone call.  Patient states he is doing ok, but not having the effect that he had before.

## 2017-04-13 ENCOUNTER — Ambulatory Visit: Payer: BLUE CROSS/BLUE SHIELD | Attending: Pain Medicine | Admitting: Pain Medicine

## 2017-04-13 ENCOUNTER — Encounter: Payer: Self-pay | Admitting: Pain Medicine

## 2017-04-13 ENCOUNTER — Other Ambulatory Visit: Payer: Self-pay

## 2017-04-13 ENCOUNTER — Ambulatory Visit: Payer: BLUE CROSS/BLUE SHIELD | Admitting: Pain Medicine

## 2017-04-13 VITALS — BP 133/80 | HR 78 | Temp 98.5°F | Ht 68.0 in | Wt 180.0 lb

## 2017-04-13 DIAGNOSIS — Z79891 Long term (current) use of opiate analgesic: Secondary | ICD-10-CM | POA: Insufficient documentation

## 2017-04-13 DIAGNOSIS — R51 Headache: Secondary | ICD-10-CM | POA: Insufficient documentation

## 2017-04-13 DIAGNOSIS — G894 Chronic pain syndrome: Secondary | ICD-10-CM | POA: Insufficient documentation

## 2017-04-13 DIAGNOSIS — I10 Essential (primary) hypertension: Secondary | ICD-10-CM | POA: Insufficient documentation

## 2017-04-13 DIAGNOSIS — M5481 Occipital neuralgia: Secondary | ICD-10-CM | POA: Diagnosis not present

## 2017-04-13 DIAGNOSIS — M542 Cervicalgia: Secondary | ICD-10-CM | POA: Diagnosis not present

## 2017-04-13 DIAGNOSIS — G8929 Other chronic pain: Secondary | ICD-10-CM

## 2017-04-13 DIAGNOSIS — R519 Headache, unspecified: Secondary | ICD-10-CM

## 2017-04-13 NOTE — Patient Instructions (Signed)

## 2017-04-13 NOTE — Progress Notes (Signed)
Patient's Name: Maxwell Richards  MRN: [id number]  Referring Provider: Maurice Small, MD  DOB: [birth year]  PCP: Maurice Small, MD  DOS: 04/13/2017  Note by: Gaspar Cola, MD  Service setting: Ambulatory outpatient  Specialty: Interventional Pain Management  Location: [facility] (AMB) Pain Management Facility    Patient type: Established   Primary Reason(s) for Visit: Encounter for post-procedure evaluation of chronic illness with mild to moderate exacerbation CC: Neck Pain (lower)  HPI  Maxwell Richards is a 46 y.o. year old, male patient, who comes today for a post-procedure evaluation. He has Long term prescription benzodiazepine use; Chronic pain syndrome; Chronic neck pain (Secondary Area of pain) (Bilateral) (L>R); Chronic head pain (Primary Area of Pain) (Left); Chronic shoulder pain [facility] Area of pain) (Bilateral) (L>R); Cervicogenic headache; Occipital headache (Left); Radicular pain of shoulder; Disorder of skeletal system; Pharmacologic therapy; Problems influencing health status; and Occipital neuralgia (Left) on their problem list. His primarily concern today is the Neck Pain (lower)  Pain Assessment: Location: Lower Neck Radiating: Radiates behind his eyes Onset: More than a month ago Duration: Chronic pain Quality: Sharp, Squeezing, Penetrating Severity: 2 /10 (self-reported pain score)  Note: Reported level is compatible with observation.             A 2/10 is viewed as "Mild to Moderate" and described as noticeable and distracting. Impossible to hide from other people. More frequent flare-ups. Still possible to adapt and function close to normal. It can be very annoying and may have occasional stronger flare-ups. With discipline, patients may get used to it and adapt.             Timing: Constant Modifying factors: resting  Maxwell Richards comes in today for post-procedure evaluation after the treatment done on 03/24/2017. The patient returns to the clinic today after having had a diagnostic left  sided C2 + TON nerve block under fluoroscopic guidance, without IV sedation. Although the day of the procedure he left indicating that he had attained 100% relief of pain, now he returns saying that he had no relief. I'm concerned about this report and I confronted him about the discrepancy. He indicates that he liked take back his his initial response. I explained to him that that doesn't work like that in fact there was assessed him before he left and he had indicated 100% relief to me. Reviewing the anatomy of these nerves and these results, there is the possibility that this C2 + TON did not help because it did not include the contributions from C1 and C4.   The third occipital nerve is the superficial medial branch of C3 dorsal ramus. It supplies the C2-C3 zygapophysial joint while crossing the joint laterally. Also, it supplies part of the semispinalis capitis muscle and its cutaneous branch supplies a small area of skin below the occiput. The greater occipital nerve and lesser occipital nerve originate from mostly C2, with variable contributions from the C1, C3, and C4 nerve roots. They share innervation of the occipital scalp, and are extensions of the cervical dorsal roots that also innervate the zygapophyseal joints of the high cervical spine. The greater occipital nerve is the largest purely afferent nerve in the body and arises from the C2 dorsal ramus with variable assistance from the C1, C3, and C4 nerve roots. They distribute supply of the occipital scalp and are elongations of the cervical dorsal roots that also innervate the zygapophyseal joints of the high cervical spine.       Further details  on both, my assessment(s), as well as the proposed treatment plan, please see below.  Post-Procedure Assessment  03/24/2017 Procedure: Diagnostic C2 + TON Nerve Block under fluoroscopic guidance, no sedation. Pre-procedure pain score:  2/10 Post-procedure pain score: 0/10 (100%  relief) Influential Factors: BMI: 27.37 kg/m Intra-procedural challenges: None observed.         Assessment challenges: Non-physiological response encountered.        Before discharge, the patient had previously reported 100% relief of the pain. Reported side-effects: None.        Post-procedural adverse reactions or complications: None reported         Sedation: No sedation used. When no sedatives are used, the analgesic levels obtained are directly associated to the effectiveness of the local anesthetics. However, when sedation is provided, the level of analgesia obtained during the initial 1 hour following the intervention, is believed to be the result of a combination of factors. These factors may include, but are not limited to: 1. The effectiveness of the local anesthetics used. 2. The effects of the analgesic(s) and/or anxiolytic(s) used. 3. The degree of discomfort experienced by the patient at the time of the procedure. 4. The patients ability and reliability in recalling and recording the events. 5. The presence and influence of possible secondary gains and/or psychosocial factors. Reported result: Relief experienced during the 1st hour after the procedure: 0 % (Ultra-Short Term Relief)            Interpretative annotation: Unexpected result. No relief from the local anesthetics would suggest pain etiology to reside elsewhere. No relief from the local anesthetics would suggest pain etiology to reside elsewhere.  Effects of local anesthetic: The analgesic effects attained during this period are directly associated to the localized infiltration of local anesthetics and therefore cary significant diagnostic value as to the etiological location, or anatomical origin, of the pain. Expected duration of relief is directly dependent on the pharmacodynamics of the local anesthetic used. Long-acting (4-6 hours) anesthetics used.  Reported result: Relief during the next 4 to 6 hour after the  procedure: 0 % (Short-Term Relief)            Interpretative annotation: Unexpected result. Non-physiologic response.     Long-term benefit: Defined as the period of time past the expected duration of local anesthetics (1 hour for short-acting and 4-6 hours for long-acting). With the possible exception of prolonged sympathetic blockade from the local anesthetics, benefits during this period are typically attributed to, or associated with, other factors such as analgesic sensory neuropraxia, antiinflammatory effects, or beneficial biochemical changes provided by agents other than the local anesthetics.  Reported result: Extended relief following procedure: 0 % (Long-Term Relief)            Interpretative annotation: Expected clinical result. No benefit. No permanent benefit expected. Persistent algesic mechanism detected. Etiology is likely mechanical rather than inflammatory.  Current benefits: Defined as reported results that persistent at this point in time.   Analgesia: 0 %            Function: Not applicable. However, he did not is that he did not help his problem with lacrimation. ROM: No benefit Interpretative annotation: No benefit. Therapeutic failure. The results of this intervention have complicated the etiological picture. When the patient had the left greater occipital nerve block, he did get numbness in the back of the head with relief of the lacrimation problems but it did not call over a portion of the scalp where he continues  to have problems. Because we had not done the third occipital nerve, the thought was that this was involved with the area affected. However, the C2 + TON nerve block should block the origins of both the greater occipital nerve and the third occipital nerve, with the exception of the contributions by C1 and C4. However, this is not where we found. The patient now says that he did not care any benefit from the procedure despite the fact that for a left I personally  spoke to him and he indicated that he was having no pain. For some reason he now wants to retract that which makes no sense to me. The fact of the matter is that despite him indicating that he did not get any relief for that first hour, clearly he did as I personally witnessed that.          Interpretation: Results would suggest failure of therapy in achieving desired goal(s). Possible adjustment in plan of care may be required          Plan:  The patient indicates that he found a surgeon in [place] that claims to be able to provide him with good relief of this pain by surgically decompressing the painful area. He is interested in this because of the possibility that this may be a permanent solution to the problem. I made it clear to the patient that this could possibly be the case and therefore he would need to consider doing that instead of any radiofrequency. I reminded him that the radiofrequency would only give him relief of the pain for 3-18 months, and the best case scenario. The patient indicates that he is interested in proceeding with radiofrequency of the occipital nerve to see if he can get some better relief with that. He indicates that when we did the greater occipital nerve block we did get a large percentage of his pain and he is willing to settle for that.  Laboratory Chemistry  Inflammation Markers (CRP: Acute Phase) (ESR: Chronic Phase) Lab Results  Component Value Date   CRP 2.5 11/23/2016   ESRSEDRATE 7 11/23/2016                 Rheumatology Markers No results found for: RF, ANA, Therisa D[place], [facility]              Renal Function Markers Lab Results  Component Value Date   BUN 11 11/23/2016   CREATININE 0.85 11/23/2016   GFRAA 120 11/23/2016   GFRNONAA 104 11/23/2016                 Hepatic Function Markers Lab Results  Component Value Date   AST 52 (H) 11/23/2016   ALT 67 (H) 11/23/2016   ALBUMIN 4.4 11/23/2016   ALKPHOS 88 11/23/2016                  Electrolytes Lab Results  Component Value Date   NA 142 11/23/2016   K 4.0 11/23/2016   CL 101 11/23/2016   CALCIUM 9.9 11/23/2016   MG 2.2 11/23/2016                 Neuropathy Markers Lab Results  Component Value Date   VITAMINB12 1,551 (H) 11/23/2016                 Bone Pathology Markers Lab Results  Component Value Date   25OHVITD1 22 (L) 11/23/2016   25OHVITD2 <1.0 11/23/2016   25OHVITD3 22 11/23/2016  Coagulation Parameters Lab Results  Component Value Date   PLT 269 03/21/2008                 Cardiovascular Markers Lab Results  Component Value Date   HGB 16.0 03/21/2008   HCT 47.0 03/21/2008                 CA Markers No results found for: CEA, CA125, LABCA2               Note: Lab results reviewed.  Recent Diagnostic Imaging Results  DG C-Arm 1-60 Min-No Report Fluoroscopy was utilized by the requesting physician.  No radiographic  interpretation.   Complexity Note: Imaging results reviewed. Results shared with Maxwell Richards, using Layman's terms.                         Meds   Current Outpatient Medications:  .  amLODipine (NORVASC) 5 MG tablet, Take 5 mg by mouth daily., Disp: , Rfl:  .  hydrochlorothiazide (MICROZIDE) 12.5 MG capsule, Take 12.5 mg by mouth daily., Disp: , Rfl: 4 .  LORazepam (ATIVAN) 1 MG tablet, Take 1 mg by mouth as needed for anxiety., Disp: , Rfl:  .  PARoxetine (PAXIL) 10 MG tablet, Take 10 mg by mouth daily., Disp: , Rfl:   ROS  Constitutional: Denies any fever or chills Gastrointestinal: No reported hemesis, hematochezia, vomiting, or acute GI distress Musculoskeletal: Denies any acute onset joint swelling, redness, loss of ROM, or weakness Neurological: No reported episodes of acute onset apraxia, aphasia, dysarthria, agnosia, amnesia, paralysis, loss of coordination, or loss of consciousness  Allergies  Maxwell Richards is allergic to cyclobenzaprine; other; and robaxin [methocarbamol].  [place]   Drug: Maxwell Richards  reports that he does not use drugs. Alcohol:  reports that he drinks alcohol. Tobacco:  reports that  has never smoked. he has never used smokeless tobacco. Medical:  has a past medical history of Angina at [facility]) and Hypertension. Surgical: Maxwell Richards  has a past surgical history that includes Nasal sinus surgery (2006). Family: family history is not on file.  Constitutional Exam  General appearance: Well nourished, well developed, and well hydrated. In no apparent acute distress Vitals:   04/13/17 1437  BP: 133/80  Pulse: 78  Temp: 98.5 F (36.9 C)  SpO2: 100%  Weight: 180 lb (81.6 kg)  Height: '5\' 8"'$  (1.727 m)   BMI Assessment: Estimated body mass index is 27.37 kg/m as calculated from the following:   Height as of this encounter: '5\' 8"'$  (1.727 m).   Weight as of this encounter: 180 lb (81.6 kg).  BMI interpretation table: BMI level Category Range association with higher incidence of chronic pain  <18 kg/m2 Underweight   18.5-24.9 kg/m2 Ideal body weight   25-29.9 kg/m2 Overweight Increased incidence by 20%  30-34.9 kg/m2 Obese (Class I) Increased incidence by 68%  35-39.9 kg/m2 Severe obesity (Class II) Increased incidence by 136%  >40 kg/m2 Extreme obesity (Class III) Increased incidence by 254%   BMI Readings from Last 4 Encounters:  04/13/17 27.37 kg/m  03/24/17 28.89 kg/m  03/16/17 28.89 kg/m  03/01/17 28.43 kg/m   Wt Readings from Last 4 Encounters:  04/13/17 180 lb (81.6 kg)  03/24/17 190 lb (86.2 kg)  03/16/17 190 lb (86.2 kg)  03/01/17 187 lb (84.8 kg)  Psych/Mental status: Alert, oriented x 3 (person, place, & time)       Eyes: PERLA Respiratory: No evidence  of acute respiratory distress  Cervical Spine Area Exam  Skin & Axial Inspection: No masses, redness, edema, swelling, or associated skin lesions Alignment: Symmetrical Functional ROM: Unrestricted ROM      Stability: No instability detected Muscle Tone/Strength: Functionally  intact. No obvious neuro-muscular anomalies detected. Sensory (Neurological): Unimpaired Palpation: No palpable anomalies              Upper Extremity (UE) Exam    Side: Right upper extremity  Side: Left upper extremity  Skin & Extremity Inspection: Skin color, temperature, and hair growth are WNL. No peripheral edema or cyanosis. No masses, redness, swelling, asymmetry, or associated skin lesions. No contractures.  Skin & Extremity Inspection: Skin color, temperature, and hair growth are WNL. No peripheral edema or cyanosis. No masses, redness, swelling, asymmetry, or associated skin lesions. No contractures.  Functional ROM: Unrestricted ROM          Functional ROM: Unrestricted ROM          Muscle Tone/Strength: Functionally intact. No obvious neuro-muscular anomalies detected.  Muscle Tone/Strength: Functionally intact. No obvious neuro-muscular anomalies detected.  Sensory (Neurological): Unimpaired          Sensory (Neurological): Unimpaired          Palpation: No palpable anomalies              Palpation: No palpable anomalies              Specialized Test(s): Deferred         Specialized Test(s): Deferred          Thoracic Spine Area Exam  Skin & Axial Inspection: No masses, redness, or swelling Alignment: Symmetrical Functional ROM: Unrestricted ROM Stability: No instability detected Muscle Tone/Strength: Functionally intact. No obvious neuro-muscular anomalies detected. Sensory (Neurological): Unimpaired Muscle strength & Tone: No palpable anomalies  Lumbar Spine Area Exam  Skin & Axial Inspection: No masses, redness, or swelling Alignment: Symmetrical Functional ROM: Unrestricted ROM      Stability: No instability detected Muscle Tone/Strength: Functionally intact. No obvious neuro-muscular anomalies detected. Sensory (Neurological): Unimpaired Palpation: No palpable anomalies       Provocative Tests: Lumbar Hyperextension and rotation test: evaluation deferred today        Lumbar Lateral bending test: evaluation deferred today       Patrick's Maneuver: evaluation deferred today                    Gait & Posture Assessment  Ambulation: Unassisted Gait: Relatively normal for age and body habitus Posture: WNL   Lower Extremity Exam    Side: Right lower extremity  Side: Left lower extremity  Skin & Extremity Inspection: Skin color, temperature, and hair growth are WNL. No peripheral edema or cyanosis. No masses, redness, swelling, asymmetry, or associated skin lesions. No contractures.  Skin & Extremity Inspection: Skin color, temperature, and hair growth are WNL. No peripheral edema or cyanosis. No masses, redness, swelling, asymmetry, or associated skin lesions. No contractures.  Functional ROM: Unrestricted ROM          Functional ROM: Unrestricted ROM          Muscle Tone/Strength: Functionally intact. No obvious neuro-muscular anomalies detected.  Muscle Tone/Strength: Functionally intact. No obvious neuro-muscular anomalies detected.  Sensory (Neurological): Unimpaired  Sensory (Neurological): Unimpaired  Palpation: No palpable anomalies  Palpation: No palpable anomalies   Assessment  Primary Diagnosis & Pertinent Problem List: The primary encounter diagnosis was Occipital neuralgia (Left). Diagnoses of Occipital headache (Left) and Chronic  head pain (Primary Area of Pain) (Left) were also pertinent to this visit.  Status Diagnosis  Controlled Controlled Controlled 1. Occipital neuralgia (Left)   2. Occipital headache (Left)   3. Chronic head pain (Primary Area of Pain) (Left)     Problems updated and reviewed during this visit: No problems updated. Plan of Care  Pharmacotherapy (Medications Ordered): No orders of the defined types were placed in this encounter. This SmartLink is deprecated. Use AVSMEDLIST instead to display the medication list for a patient. Medications administered today: Jui Chi Richards had no medications administered during this  visit.   Procedure Orders     Occipital Nerve Radiofrequency Lab Orders  No laboratory test(s) ordered today   Imaging Orders  No imaging studies ordered today   Referral Orders  No referral(s) requested today    Interventional management options: Planned, scheduled, and/or pending:   Therapeutic left-sided greater occipital nerve RFA under fluoroscopic guidance and IV sedation.   Considering:   Therapeutic left-sided greater occipital nerve RFA    Palliative PRN treatment(s):   None at this time   Provider-requested follow-up: Return for RFA (fluoro + sedation): (L) GON RFA.  Future Appointments  Date Time Provider [place]  06/09/2017 10:30 AM Milinda Pointer, MD [facility] None   Primary Care Physician: Maurice Small, MD Location: [facility] Outpatient Pain Management Facility Note by: Gaspar Cola, MD Date: 04/13/2017; Time: 3:36 PM

## 2017-05-25 ENCOUNTER — Telehealth: Payer: Self-pay | Admitting: *Deleted

## 2017-05-25 NOTE — Telephone Encounter (Signed)
Talked with pt. Very concern that RF wouldn't work and is going to talk with Dr Shauna HughNaveria before the procedure.

## 2017-06-02 DIAGNOSIS — H6691 Otitis media, unspecified, right ear: Secondary | ICD-10-CM | POA: Diagnosis not present

## 2017-06-02 DIAGNOSIS — N3000 Acute cystitis without hematuria: Secondary | ICD-10-CM | POA: Diagnosis not present

## 2017-06-02 DIAGNOSIS — R6889 Other general symptoms and signs: Secondary | ICD-10-CM | POA: Diagnosis not present

## 2017-06-09 ENCOUNTER — Ambulatory Visit: Payer: BLUE CROSS/BLUE SHIELD | Admitting: Pain Medicine

## 2017-07-05 ENCOUNTER — Ambulatory Visit: Payer: BLUE CROSS/BLUE SHIELD | Admitting: Pain Medicine

## 2017-07-11 DIAGNOSIS — K219 Gastro-esophageal reflux disease without esophagitis: Secondary | ICD-10-CM | POA: Diagnosis not present

## 2017-07-11 DIAGNOSIS — Z113 Encounter for screening for infections with a predominantly sexual mode of transmission: Secondary | ICD-10-CM | POA: Diagnosis not present

## 2017-07-11 DIAGNOSIS — E119 Type 2 diabetes mellitus without complications: Secondary | ICD-10-CM | POA: Diagnosis not present

## 2017-11-17 DIAGNOSIS — R300 Dysuria: Secondary | ICD-10-CM | POA: Diagnosis not present

## 2017-11-17 DIAGNOSIS — N481 Balanitis: Secondary | ICD-10-CM | POA: Diagnosis not present

## 2017-11-17 DIAGNOSIS — I10 Essential (primary) hypertension: Secondary | ICD-10-CM | POA: Diagnosis not present

## 2017-12-20 DIAGNOSIS — R300 Dysuria: Secondary | ICD-10-CM | POA: Diagnosis not present

## 2017-12-28 DIAGNOSIS — E119 Type 2 diabetes mellitus without complications: Secondary | ICD-10-CM | POA: Diagnosis not present

## 2017-12-28 DIAGNOSIS — Z125 Encounter for screening for malignant neoplasm of prostate: Secondary | ICD-10-CM | POA: Diagnosis not present

## 2017-12-28 DIAGNOSIS — I10 Essential (primary) hypertension: Secondary | ICD-10-CM | POA: Diagnosis not present

## 2017-12-28 DIAGNOSIS — E785 Hyperlipidemia, unspecified: Secondary | ICD-10-CM | POA: Diagnosis not present

## 2017-12-28 DIAGNOSIS — Z0000 Encounter for general adult medical examination without abnormal findings: Secondary | ICD-10-CM | POA: Diagnosis not present

## 2018-01-23 DIAGNOSIS — R300 Dysuria: Secondary | ICD-10-CM | POA: Diagnosis not present

## 2018-02-02 DIAGNOSIS — N411 Chronic prostatitis: Secondary | ICD-10-CM | POA: Diagnosis not present

## 2018-02-02 DIAGNOSIS — R972 Elevated prostate specific antigen [PSA]: Secondary | ICD-10-CM | POA: Diagnosis not present

## 2018-02-27 DIAGNOSIS — L918 Other hypertrophic disorders of the skin: Secondary | ICD-10-CM | POA: Diagnosis not present

## 2018-02-27 DIAGNOSIS — Z23 Encounter for immunization: Secondary | ICD-10-CM | POA: Diagnosis not present

## 2018-03-24 IMAGING — CR DG CERVICAL SPINE WITH FLEX & EXTEND
1 series · 8 of 9 positions shown · non-contrast
Comparison: [HOSPITAL] neurosurgery Cervical spine MRI 12/05/2015,
radiographs 12/01/2015

CLINICAL DATA: 46-year-old male with chronic cervical neck pain
radiating to the shoulders more so the left. Remote injury to the
back of the head in [REDACTED].

EXAM:
CERVICAL SPINE COMPLETE WITH FLEXION AND EXTENSION VIEWS

[Series 1: dg cervical spine with flex & extend · 0.14mm/px · 8 of 9 slices shown]
[im 1/9]
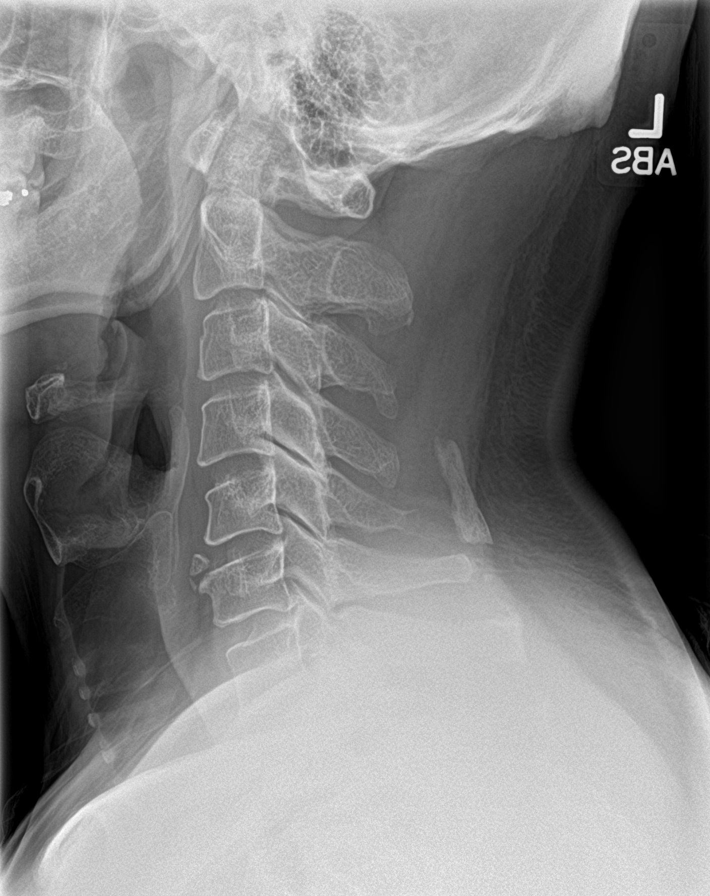
[im 2/9]
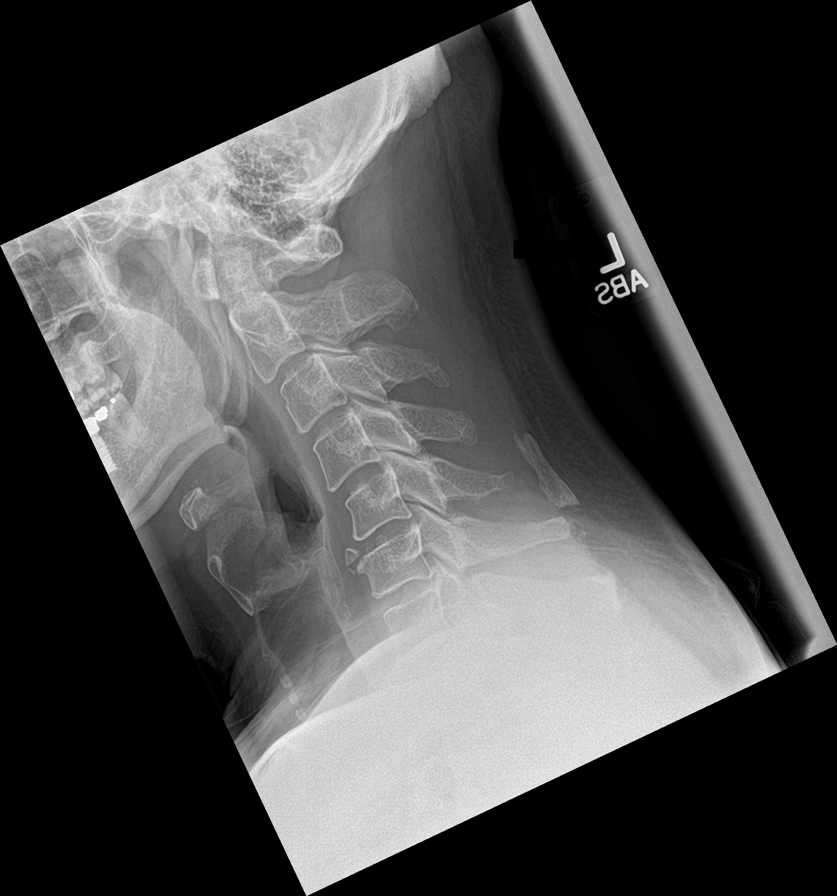
[im 3/9]
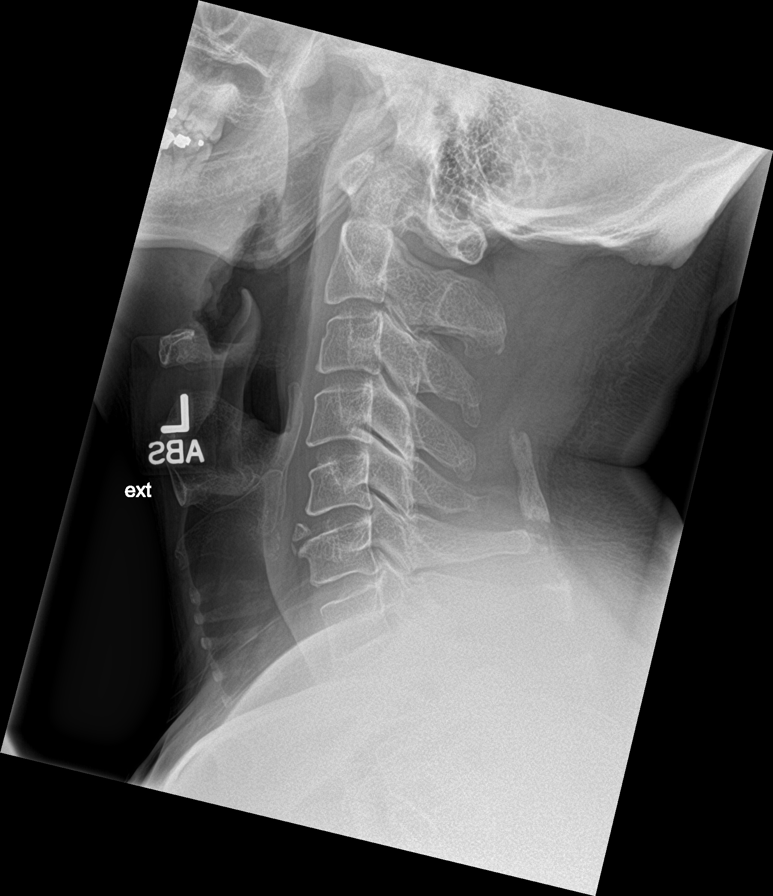
[im 4/9]
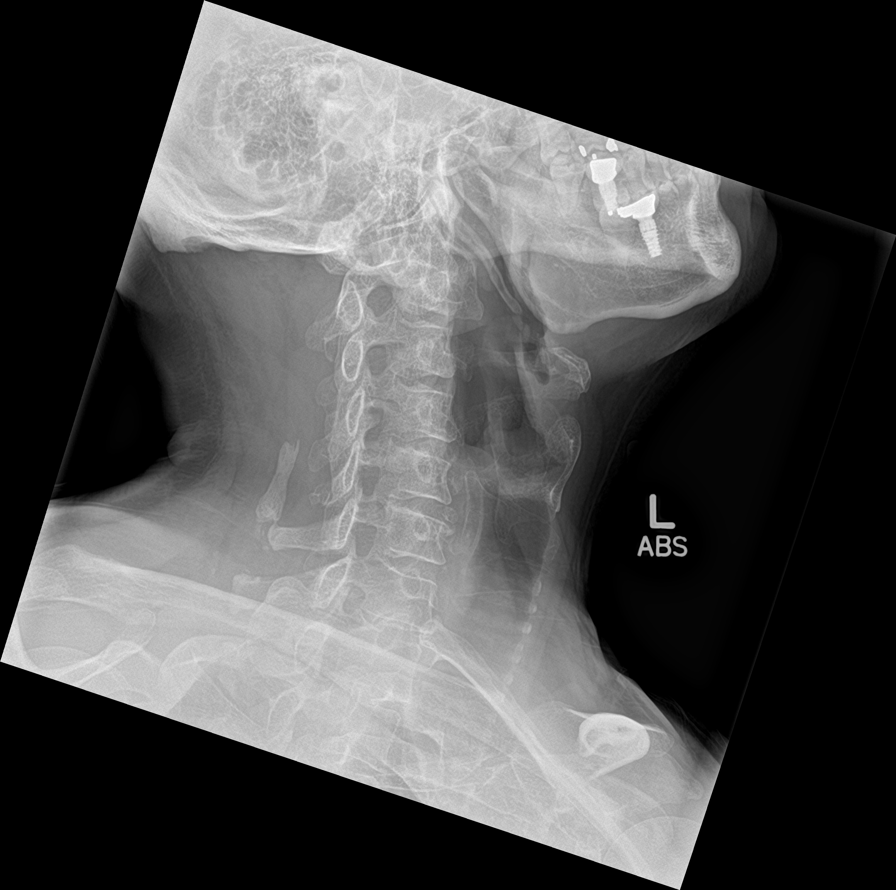
[im 5/9]
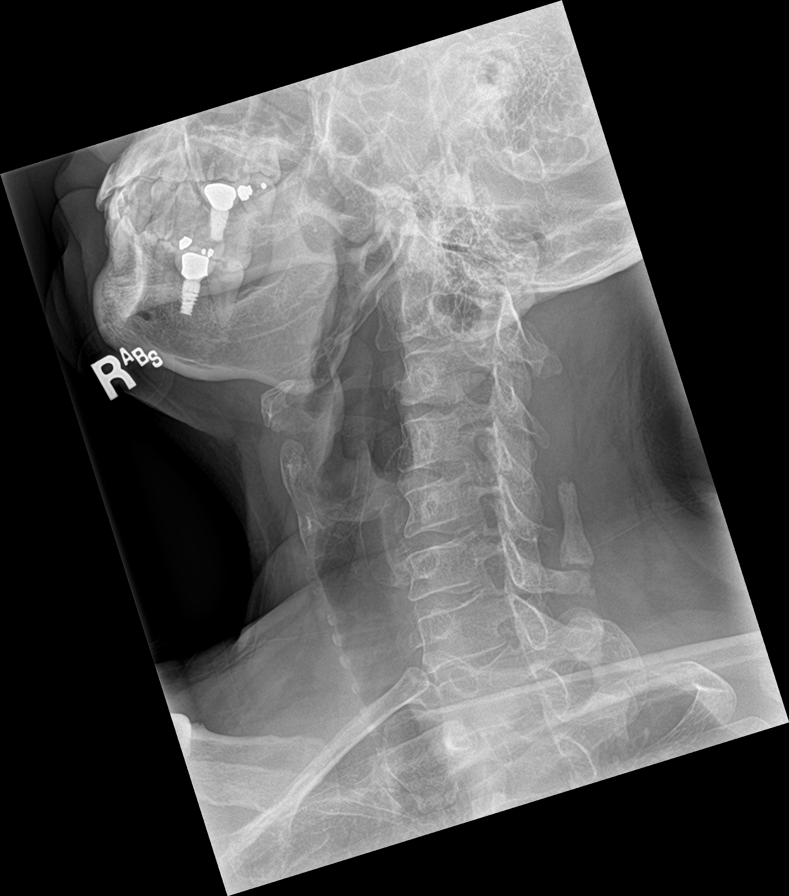
[im 6/9]
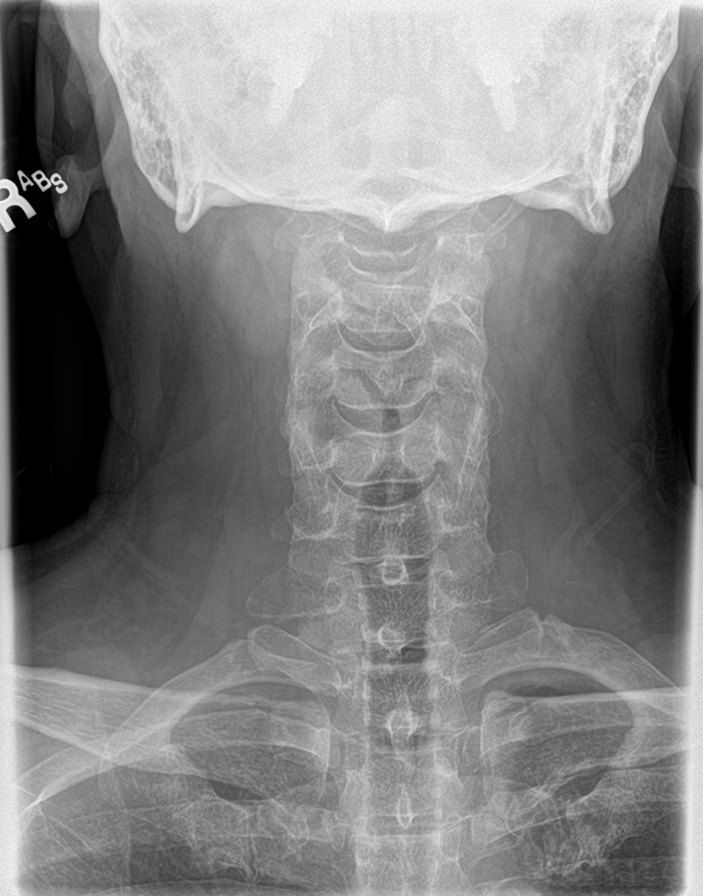
[im 7/9]
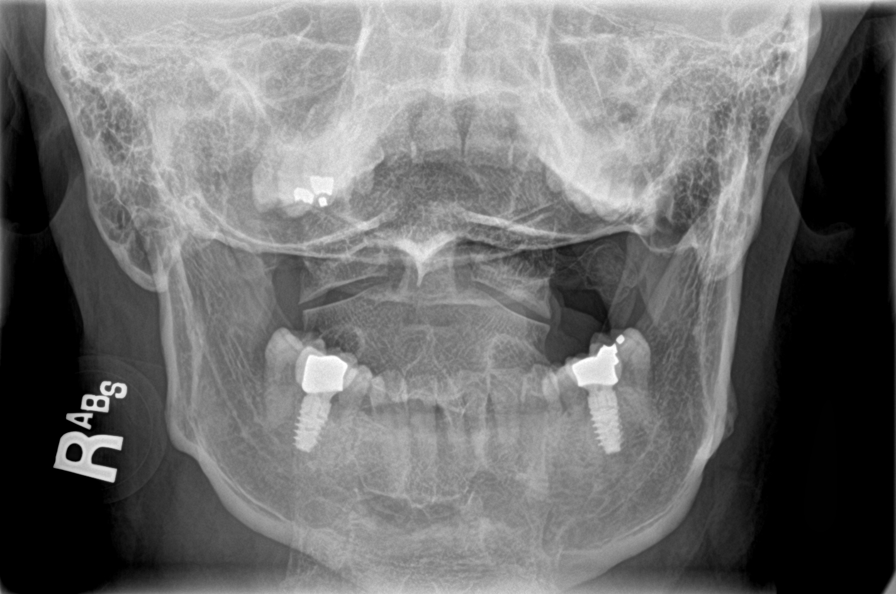
[im 8/9]
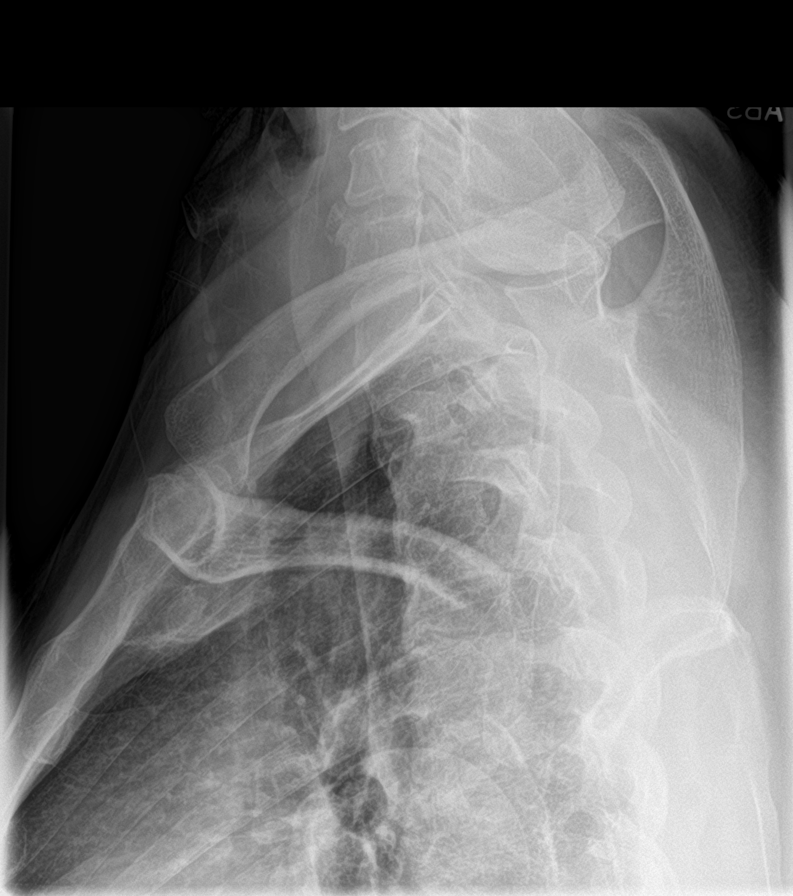

[8 of 9 positions shown; findings below may reference images not displayed]

FINDINGS: Chronic straightening of cervical lordosis. Prevertebral soft tissue
contours remain normal. Chronic anterior endplate degeneration is
stable at C5-C6. Prominent but inconsequential appearing posterior
nuchal calcification.

Lateral views in neutral flexion and extension positioning. Mild
focal kyphosis occurs at the C5-C6 level. Somewhat decreased range
of motion demonstrated in both flexion and extension, but no other
abnormal motion identified.

Bilateral posterior element alignment is within normal limits.
Bilateral C4-C5 facet hypertrophy is evident. Probable associated
bilateral C5 neural foraminal stenosis. Normal AP alignment. Normal
C1-C2 alignment and joint spaces. Cervicothoracic junction alignment
is within normal limits. Negative lung apices.
IMPRESSION: 1.  No acute osseous abnormality identified in the cervical spine.
2. Chronic disc and endplate degeneration at C5-C6. Mild focal
kyphosis at that level with flexion is likely degenerative in
nature. No other abnormal motion identified.
3. Chronic bilateral C4-C5 facet degeneration and bilateral C5
neural foraminal stenosis suspected.

## 2018-03-24 IMAGING — CR DG SHOULDER 2+V*R*
1 series · 3 of 3 positions shown · non-contrast
Comparison: None.

CLINICAL DATA: Chronic neck pain radiating into both shoulders,
initial encounter

EXAM:
RIGHT SHOULDER - 2+ VIEW

[Series 1: dg shoulder right · 0.14mm/px · 3 of 3 slices shown]
[im 1/3]
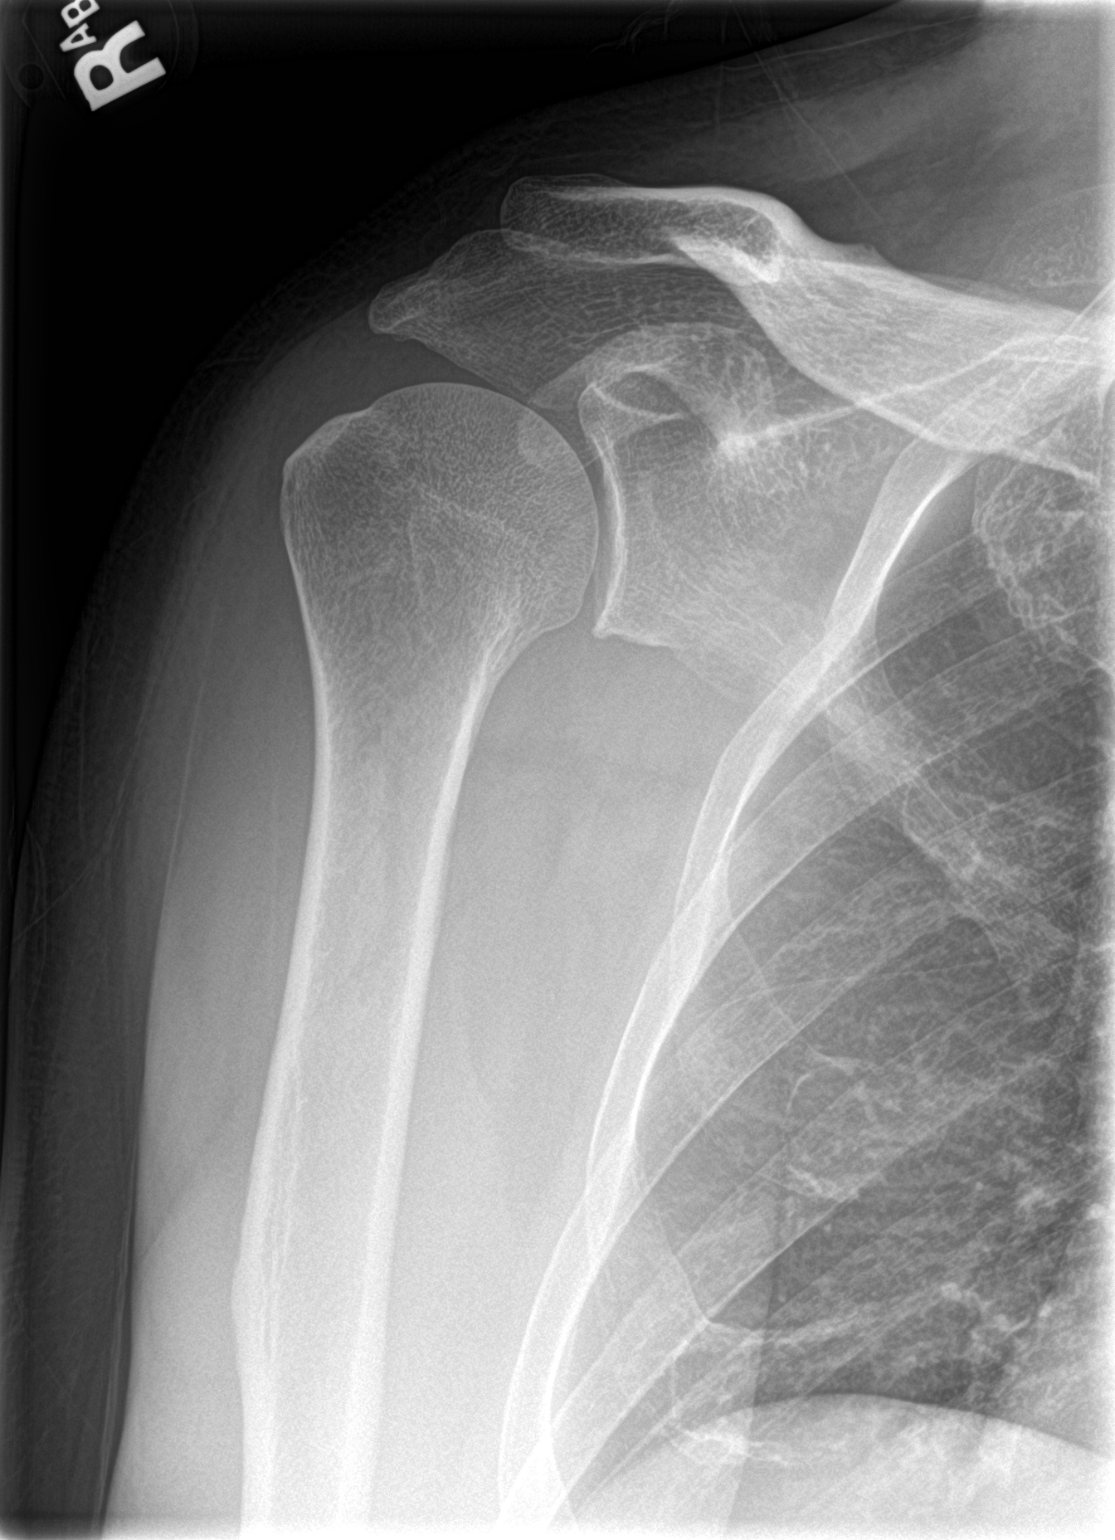
[im 2/3]
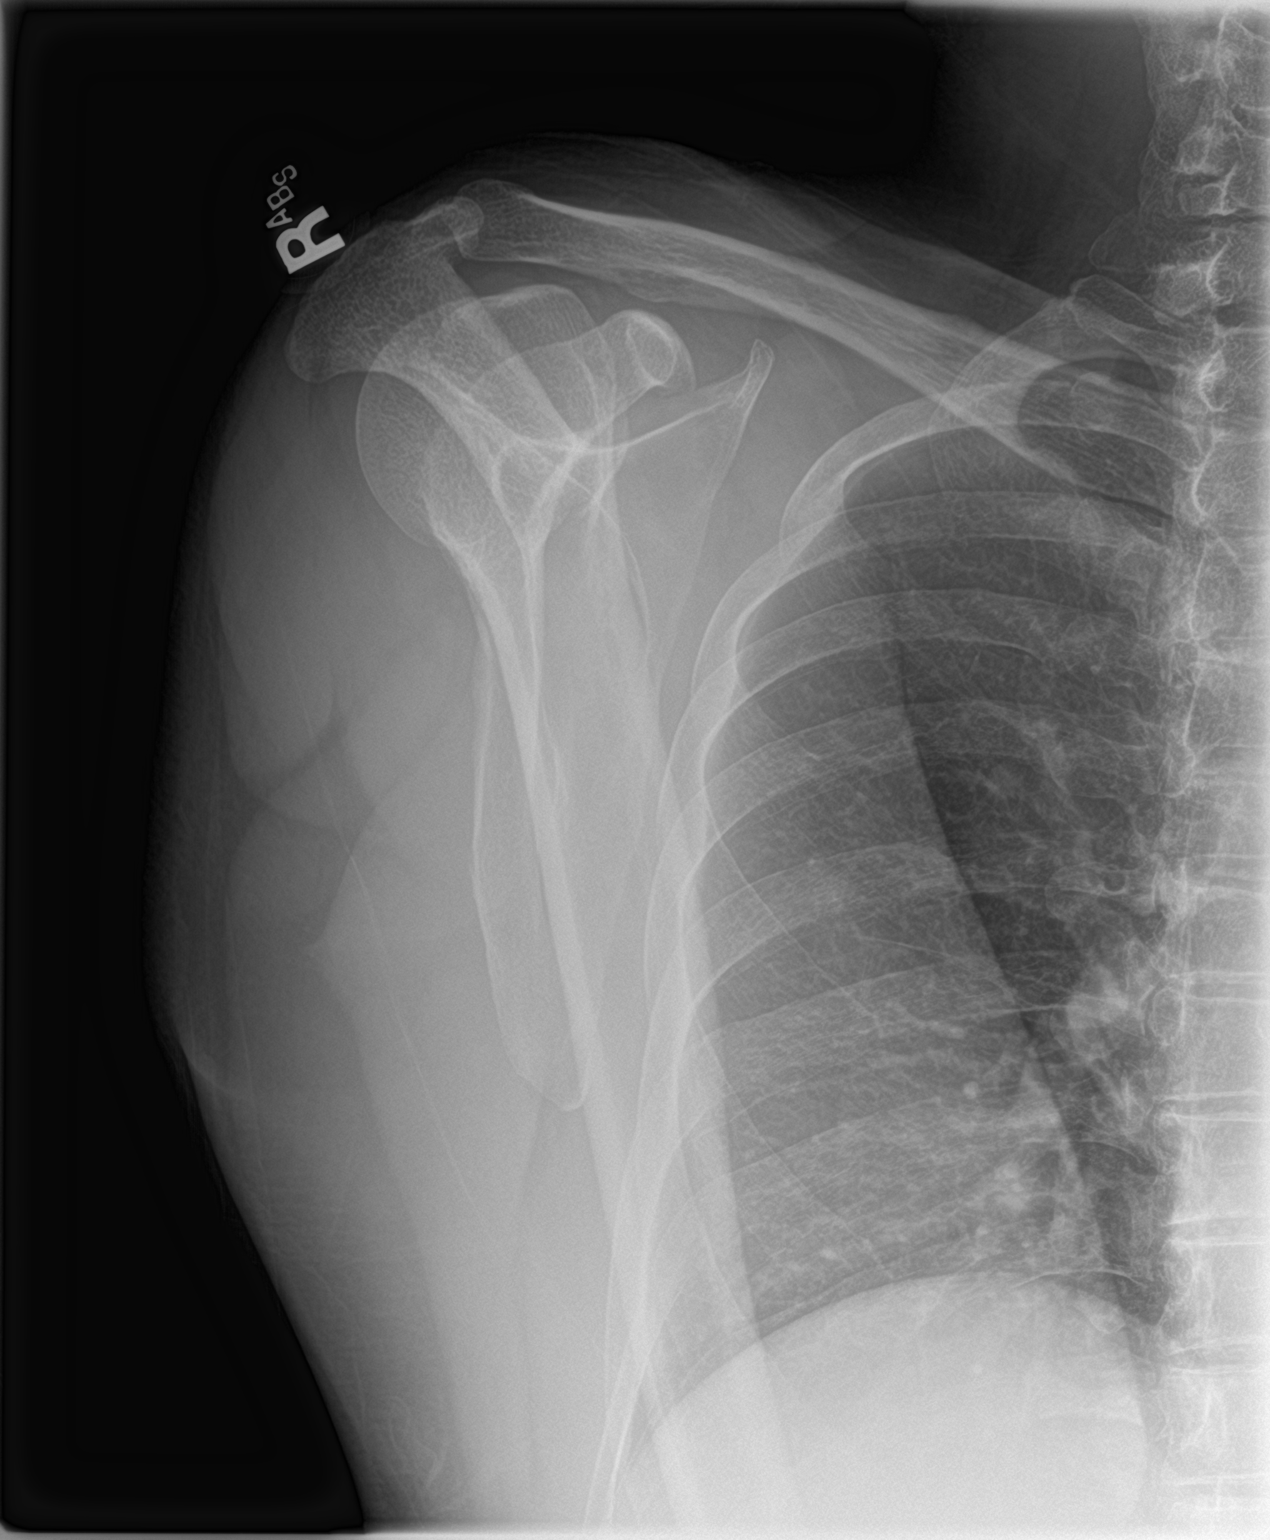
[im 3/3]
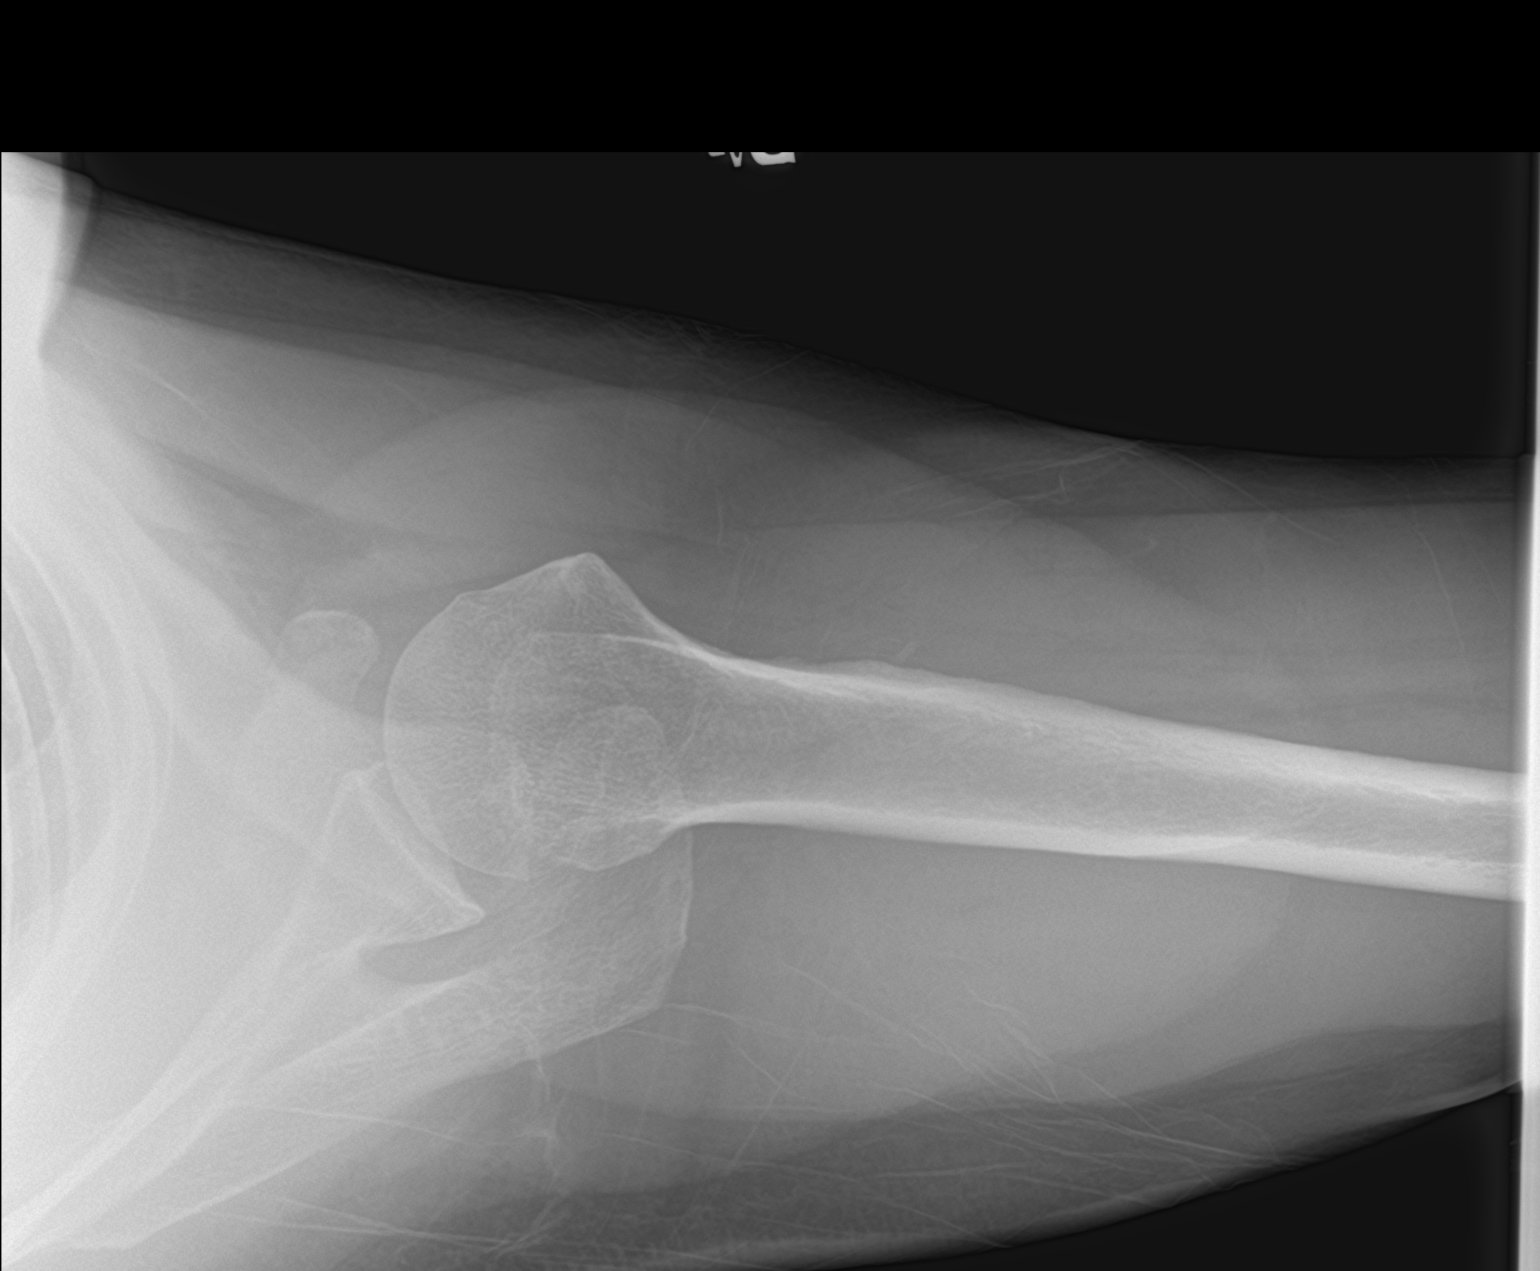

[3 of 3 positions shown; findings below may reference images not displayed]

FINDINGS: There is no evidence of fracture or dislocation. There is no
evidence of arthropathy or other focal bone abnormality. Soft
tissues are unremarkable.
IMPRESSION: No acute abnormality noted.

## 2018-06-08 DIAGNOSIS — N411 Chronic prostatitis: Secondary | ICD-10-CM | POA: Diagnosis not present

## 2018-10-05 DIAGNOSIS — N411 Chronic prostatitis: Secondary | ICD-10-CM | POA: Diagnosis not present

## 2018-10-24 DIAGNOSIS — N411 Chronic prostatitis: Secondary | ICD-10-CM | POA: Diagnosis not present

## 2019-02-13 DIAGNOSIS — N411 Chronic prostatitis: Secondary | ICD-10-CM | POA: Diagnosis not present

## 2019-02-21 DIAGNOSIS — E785 Hyperlipidemia, unspecified: Secondary | ICD-10-CM | POA: Diagnosis not present

## 2019-02-21 DIAGNOSIS — E119 Type 2 diabetes mellitus without complications: Secondary | ICD-10-CM | POA: Diagnosis not present

## 2019-02-21 DIAGNOSIS — I10 Essential (primary) hypertension: Secondary | ICD-10-CM | POA: Diagnosis not present

## 2019-02-21 DIAGNOSIS — Z0000 Encounter for general adult medical examination without abnormal findings: Secondary | ICD-10-CM | POA: Diagnosis not present

## 2019-04-17 DIAGNOSIS — N411 Chronic prostatitis: Secondary | ICD-10-CM | POA: Diagnosis not present

## 2019-04-26 DIAGNOSIS — N411 Chronic prostatitis: Secondary | ICD-10-CM | POA: Diagnosis not present

## 2019-05-16 DIAGNOSIS — N411 Chronic prostatitis: Secondary | ICD-10-CM | POA: Diagnosis not present

## 2019-06-27 DIAGNOSIS — H5213 Myopia, bilateral: Secondary | ICD-10-CM | POA: Diagnosis not present

## 2019-06-27 DIAGNOSIS — H40013 Open angle with borderline findings, low risk, bilateral: Secondary | ICD-10-CM | POA: Diagnosis not present

## 2019-06-27 DIAGNOSIS — R197 Diarrhea, unspecified: Secondary | ICD-10-CM | POA: Diagnosis not present

## 2019-06-27 DIAGNOSIS — N411 Chronic prostatitis: Secondary | ICD-10-CM | POA: Diagnosis not present

## 2019-07-04 DIAGNOSIS — N411 Chronic prostatitis: Secondary | ICD-10-CM | POA: Diagnosis not present

## 2019-07-04 DIAGNOSIS — I10 Essential (primary) hypertension: Secondary | ICD-10-CM | POA: Diagnosis not present

## 2019-07-04 DIAGNOSIS — R197 Diarrhea, unspecified: Secondary | ICD-10-CM | POA: Diagnosis not present

## 2019-07-05 DIAGNOSIS — I10 Essential (primary) hypertension: Secondary | ICD-10-CM | POA: Diagnosis not present

## 2019-07-05 DIAGNOSIS — Z125 Encounter for screening for malignant neoplasm of prostate: Secondary | ICD-10-CM | POA: Diagnosis not present

## 2019-07-05 DIAGNOSIS — R197 Diarrhea, unspecified: Secondary | ICD-10-CM | POA: Diagnosis not present

## 2019-09-10 DIAGNOSIS — N411 Chronic prostatitis: Secondary | ICD-10-CM | POA: Diagnosis not present

## 2019-09-10 DIAGNOSIS — R102 Pelvic and perineal pain: Secondary | ICD-10-CM | POA: Diagnosis not present

## 2019-09-21 DIAGNOSIS — N200 Calculus of kidney: Secondary | ICD-10-CM | POA: Diagnosis not present

## 2019-09-21 DIAGNOSIS — K8020 Calculus of gallbladder without cholecystitis without obstruction: Secondary | ICD-10-CM | POA: Diagnosis not present

## 2019-09-21 DIAGNOSIS — R102 Pelvic and perineal pain: Secondary | ICD-10-CM | POA: Diagnosis not present

## 2019-11-08 DIAGNOSIS — N411 Chronic prostatitis: Secondary | ICD-10-CM | POA: Diagnosis not present

## 2019-11-08 DIAGNOSIS — M62838 Other muscle spasm: Secondary | ICD-10-CM | POA: Diagnosis not present

## 2019-11-08 DIAGNOSIS — M6289 Other specified disorders of muscle: Secondary | ICD-10-CM | POA: Diagnosis not present

## 2019-11-08 DIAGNOSIS — M6281 Muscle weakness (generalized): Secondary | ICD-10-CM | POA: Diagnosis not present

## 2019-12-13 DIAGNOSIS — M6281 Muscle weakness (generalized): Secondary | ICD-10-CM | POA: Diagnosis not present

## 2019-12-13 DIAGNOSIS — N411 Chronic prostatitis: Secondary | ICD-10-CM | POA: Diagnosis not present

## 2019-12-13 DIAGNOSIS — R972 Elevated prostate specific antigen [PSA]: Secondary | ICD-10-CM | POA: Diagnosis not present

## 2019-12-13 DIAGNOSIS — R102 Pelvic and perineal pain: Secondary | ICD-10-CM | POA: Diagnosis not present

## 2020-02-22 DIAGNOSIS — E785 Hyperlipidemia, unspecified: Secondary | ICD-10-CM | POA: Diagnosis not present

## 2020-02-22 DIAGNOSIS — Z0000 Encounter for general adult medical examination without abnormal findings: Secondary | ICD-10-CM | POA: Diagnosis not present

## 2020-02-22 DIAGNOSIS — Z125 Encounter for screening for malignant neoplasm of prostate: Secondary | ICD-10-CM | POA: Diagnosis not present

## 2020-02-22 DIAGNOSIS — E119 Type 2 diabetes mellitus without complications: Secondary | ICD-10-CM | POA: Diagnosis not present

## 2020-02-22 DIAGNOSIS — I10 Essential (primary) hypertension: Secondary | ICD-10-CM | POA: Diagnosis not present

## 2020-02-26 DIAGNOSIS — Z1211 Encounter for screening for malignant neoplasm of colon: Secondary | ICD-10-CM | POA: Diagnosis not present

## 2020-03-30 ENCOUNTER — Emergency Department (HOSPITAL_COMMUNITY): Payer: BC Managed Care – PPO

## 2020-03-30 ENCOUNTER — Emergency Department (HOSPITAL_COMMUNITY)
Admission: EM | Admit: 2020-03-30 | Discharge: 2020-03-30 | Disposition: A | Discharge status: Home / Self Care | Payer: BC Managed Care – PPO | Attending: Emergency Medicine | Admitting: Emergency Medicine

## 2020-03-30 ENCOUNTER — Other Ambulatory Visit: Payer: Self-pay

## 2020-03-30 ENCOUNTER — Encounter (HOSPITAL_COMMUNITY): Payer: Self-pay | Admitting: Emergency Medicine

## 2020-03-30 DIAGNOSIS — I10 Essential (primary) hypertension: Secondary | ICD-10-CM | POA: Diagnosis not present

## 2020-03-30 DIAGNOSIS — R1084 Generalized abdominal pain: Secondary | ICD-10-CM

## 2020-03-30 DIAGNOSIS — K8020 Calculus of gallbladder without cholecystitis without obstruction: Secondary | ICD-10-CM | POA: Diagnosis not present

## 2020-03-30 DIAGNOSIS — R109 Unspecified abdominal pain: Secondary | ICD-10-CM | POA: Diagnosis not present

## 2020-03-30 DIAGNOSIS — R112 Nausea with vomiting, unspecified: Secondary | ICD-10-CM | POA: Diagnosis not present

## 2020-03-30 DIAGNOSIS — Z79899 Other long term (current) drug therapy: Secondary | ICD-10-CM | POA: Insufficient documentation

## 2020-03-30 DIAGNOSIS — R140 Abdominal distension (gaseous): Secondary | ICD-10-CM | POA: Diagnosis not present

## 2020-03-30 DIAGNOSIS — R1110 Vomiting, unspecified: Secondary | ICD-10-CM | POA: Diagnosis not present

## 2020-03-30 LAB — URINALYSIS, ROUTINE W REFLEX MICROSCOPIC
Bilirubin Urine: NEGATIVE
Glucose, UA: 150 mg/dL — AB
Hgb urine dipstick: NEGATIVE
Ketones, ur: 20 mg/dL — AB
Leukocytes,Ua: NEGATIVE
Nitrite: NEGATIVE
Protein, ur: NEGATIVE mg/dL
Specific Gravity, Urine: 1.015 (ref 1.005–1.030)
pH: 8 (ref 5.0–8.0)

## 2020-03-30 LAB — COMPREHENSIVE METABOLIC PANEL
ALT: 30 U/L (ref 0–44)
AST: 27 U/L (ref 15–41)
Albumin: 4.7 g/dL (ref 3.5–5.0)
Alkaline Phosphatase: 73 U/L (ref 38–126)
Anion gap: 13 (ref 5–15)
BUN: 12 mg/dL (ref 6–20)
CO2: 24 mmol/L (ref 22–32)
Calcium: 9.1 mg/dL (ref 8.9–10.3)
Chloride: 102 mmol/L (ref 98–111)
Creatinine, Ser: 0.63 mg/dL (ref 0.61–1.24)
GFR, Estimated: >60 mL/min (ref >60)
Glucose, Bld: 171 mg/dL — ABNORMAL HIGH (ref 70–99)
Potassium: 3.4 mmol/L — ABNORMAL LOW (ref 3.5–5.1)
Sodium: 139 mmol/L (ref 135–145)
Total Bilirubin: 0.8 mg/dL (ref 0.3–1.2)
Total Protein: 7.8 g/dL (ref 6.5–8.1)

## 2020-03-30 LAB — CBC
HCT: 44.5 % (ref 39.0–52.0)
Hemoglobin: 15 g/dL (ref 13.0–17.0)
MCH: 29.4 pg (ref 26.0–34.0)
MCHC: 33.7 g/dL (ref 30.0–36.0)
MCV: 87.1 fL (ref 80.0–100.0)
Platelets: 283 10*3/uL (ref 150–400)
RBC: 5.11 MIL/uL (ref 4.22–5.81)
RDW: 12.5 % (ref 11.5–15.5)
WBC: 17 10*3/uL — ABNORMAL HIGH (ref 4.0–10.5)
nRBC: 0 % (ref 0.0–0.2)

## 2020-03-30 LAB — LIPASE, BLOOD: Lipase: 26 U/L (ref 11–51)

## 2020-03-30 MED ORDER — SODIUM CHLORIDE 0.9 % IV BOLUS
1000.0000 mL | Freq: Once | INTRAVENOUS | Status: AC
  Administered 2020-03-30: 1000 mL via INTRAVENOUS

## 2020-03-30 MED ORDER — MORPHINE SULFATE (PF) 4 MG/ML IV SOLN
4.0000 mg | Freq: Once | INTRAVENOUS | Status: AC
  Administered 2020-03-30: 4 mg via INTRAVENOUS
  Filled 2020-03-30: qty 1

## 2020-03-30 MED ORDER — IOHEXOL 300 MG/ML  SOLN
100.0000 mL | Freq: Once | INTRAMUSCULAR | Status: AC | PRN
  Administered 2020-03-30: 100 mL via INTRAVENOUS

## 2020-03-30 NOTE — ED Triage Notes (Signed)
Patient here from home reporting abd pain, n/v that start today around 3am.

## 2020-03-30 NOTE — ED Notes (Signed)
Patient transported to CT 

## 2020-03-30 NOTE — ED Provider Notes (Signed)
[facility]   CSN: [id number] Arrival date & time: 03/30/20  1119     History Chief Complaint  Patient presents with  . Abdominal Pain  . Nausea  . Emesis    Maxwell Richards is a 49 y.o. male.  HPI     49 year old male with history of hypertension comes in a chief complaint of abdominal pain, nausea and vomiting. Patient symptoms started last night.  He reports that he had changed his diet given elevated A1c.  Yesterday he had fast food.  In the middle the night he started having abdominal pain, nausea and vomiting.  Pain is generalized.  It was 10 out of 10 prior to ED arrival.  It has come down to about 6 out of 10 now.  Pain has been constant.  There is associated nausea with vomiting.  Patient has had emesis x3.  He thinks he is passing flatus but less than usual.  Patient is feeling bloated as well.  No history of abdominal surgery or small obstruction.   Past Medical History:  Diagnosis Date  . Angina at rest [facility])   . Hypertension     Patient Active Problem List   Diagnosis Date Noted  . Occipital neuralgia (Left) 03/16/2017  . Disorder of skeletal system 01/25/2017  . Pharmacologic therapy 01/25/2017  . Problems influencing health status 01/25/2017  . Cervicogenic headache 12/28/2016  . Occipital headache (Left) 12/28/2016  . Radicular pain of shoulder 12/28/2016  . Chronic neck pain (Secondary Area of pain) (Bilateral) (L>R) 11/25/2016  . Chronic head pain (Primary Area of Pain) (Left) 11/25/2016  . Chronic shoulder pain [facility] Area of pain) (Bilateral) (L>R) 11/25/2016  . Long term prescription benzodiazepine use 11/23/2016  . Chronic pain syndrome 11/23/2016    Past Surgical History:  Procedure Laterality Date  . NASAL SINUS SURGERY  2006       No family history on file.  Social History   Tobacco Use  . Smoking status: Never Smoker  . Smokeless tobacco: Never Used  Substance Use Topics  .  Alcohol use: Yes    Comment: occassional  . Drug use: No    Home Medications Prior to Admission medications   Medication Sig Start Date End Date Taking? Authorizing Provider  amLODipine (NORVASC) 5 MG tablet Take 5 mg by mouth daily.    [provider]  hydrochlorothiazide (MICROZIDE) 12.5 MG capsule Take 12.5 mg by mouth daily. 01/20/17   [provider]  LORazepam (ATIVAN) 1 MG tablet Take 1 mg by mouth as needed for anxiety.    [provider]  PARoxetine (PAXIL) 10 MG tablet Take 10 mg by mouth daily.    [provider]    Allergies    Cyclobenzaprine, Other, and Robaxin [methocarbamol]  Review of Systems   Review of Systems  Constitutional: Positive for activity change.  Respiratory: Negative for shortness of breath.   Cardiovascular: Negative for chest pain.  Gastrointestinal: Positive for abdominal pain, constipation, nausea and vomiting. Negative for blood in stool and diarrhea.  All other systems reviewed and are negative.   Physical Exam Updated Vital Signs BP (!) 186/107   Pulse 73   Temp 98 F (36.7 C) (Oral)   Resp 19   Ht 5\' 8"  (1.727 m)   Wt 84.6 kg   SpO2 98%   BMI 28.36 kg/m   Physical Exam Vitals and nursing note reviewed.  Constitutional:      Appearance: He is well-developed.  HENT:     Head: Normocephalic and atraumatic.  Eyes:     Conjunctiva/sclera: Conjunctivae normal.     Pupils: Pupils are equal, round, and reactive to light.  Cardiovascular:     Rate and Rhythm: Normal rate and regular rhythm.  Pulmonary:     Effort: Pulmonary effort is normal.     Breath sounds: Normal breath sounds.  Abdominal:     General: Bowel sounds are normal. There is no distension.     Palpations: Abdomen is soft. There is no mass.     Tenderness: There is generalized abdominal tenderness. There is no guarding or rebound. Negative signs include Murphy's sign and McBurney's sign.     Hernia: No hernia is present.    Musculoskeletal:        General: No deformity.     Cervical back: Normal range of motion and neck supple.  Skin:    General: Skin is warm.  Neurological:     Mental Status: He is alert and oriented to person, place, and time.     ED Results / Procedures / Treatments   Labs (all labs ordered are listed, but only abnormal results are displayed) Labs Reviewed  COMPREHENSIVE METABOLIC PANEL - Abnormal; Notable for the following components:      Result Value   Potassium 3.4 (*)    Glucose, Bld 171 (*)    All other components within normal limits  CBC - Abnormal; Notable for the following components:   WBC 17.0 (*)    All other components within normal limits  URINALYSIS, ROUTINE W REFLEX MICROSCOPIC - Abnormal; Notable for the following components:   APPearance HAZY (*)    Glucose, UA 150 (*)    Ketones, ur 20 (*)    All other components within normal limits  LIPASE, BLOOD    EKG None  Radiology CT ABDOMEN PELVIS W CONTRAST  Result Date: 03/30/2020 CLINICAL DATA:  Abdominal distension. Evaluate for possible bowel obstruction. Nausea and vomiting beginning this morning. EXAM: CT ABDOMEN AND PELVIS WITH CONTRAST TECHNIQUE: Multidetector CT imaging of the abdomen and pelvis was performed using the standard protocol following bolus administration of intravenous contrast. CONTRAST:  OMNIPAQUE IOHEXOL 300 MG/ML  SOLN COMPARISON:  09/21/2019 FINDINGS: Lower chest: Lung bases are clear. Hepatobiliary: 1.1 cm hypodensity over the inferior right lobe of the liver unchanged likely a cyst. 2 mm stone over the gallbladder neck with subtle ill definition of the gallbladder wall in this region. Biliary tree is normal. Pancreas: Normal. Spleen: Normal. Adrenals/Urinary Tract: Adrenal glands are normal. Kidneys are normal in size and demonstrate a punctate nonobstructing stone over the mid pole left kidney. Stable 1 cm hypodensity over the lower pole right kidney too small to characterize but  likely a cyst. No evidence of hydronephrosis. Ureters and bladder are normal. Stomach/Bowel: Stomach and small bowel are normal. Appendix is normal. Colon is normal. Vascular/Lymphatic: Mild calcified plaque over the abdominal aorta which is normal in caliber. No adenopathy. Reproductive: Normal. Other: No free fluid or focal inflammatory change. Musculoskeletal: Minimal degenerate change of the spine. 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 IMPRESSION: 1. No acute findings in the abdomen/pelvis. No evidence of bowel obstruction. 2. 2 mm stone over the gallbladder neck with subtle ill definition of the gallbladder wall in this region. Right upper quadrant ultrasound may be helpful for further evaluation. 3. Stable 1.1 cm right liver cyst. 1 cm right renal hypodensity too small to characterize,  but likely a cyst. 4. Aortic atherosclerosis. Aortic Atherosclerosis (ICD10-I70.0). Electronically Signed   By: Elberta Fortis M.D.   On: 03/30/2020 16:09   US Abdomen Limited RUQ (LIVER/GB)  Result Date: 03/30/2020 CLINICAL DATA:  Right upper quadrant abdominal pain EXAM: ULTRASOUND ABDOMEN LIMITED RIGHT UPPER QUADRANT COMPARISON:  CT from the same day. FINDINGS: Gallbladder: There is gallbladder sludge without evidence for cholelithiasis. There is no gallbladder wall thickening. The sonographic Eulah Pont sign is negative. Common bile duct: Diameter: 6 mm Liver: There is no concerning focal lesion in the liver. There is a cystic appearing 1 cm lesion in the right hepatic lobe. Portal vein is patent on color Doppler imaging with normal direction of blood flow towards the liver. Other: None. IMPRESSION: Gallbladder sludge without evidence for acute cholecystitis. Electronically Signed   By: Katherine Mantle M.D.   On: 03/30/2020 19:20    Procedures Procedures (including critical care time)  Medications Ordered in ED Medications  morphine 4 MG/ML injection 4  mg (4 mg Intravenous Given 03/30/20 1417)  sodium chloride 0.9 % bolus 1,000 mL (0 mLs Intravenous Stopped 03/30/20 1720)  iohexol (OMNIPAQUE) 300 MG/ML solution 100 mL (100 mLs Intravenous Contrast Given 03/30/20 1520)    ED Course  I have reviewed the triage vital signs and the nursing notes.  Pertinent labs & imaging results that were available during my care of the patient were reviewed by me and considered in my medical decision making (see chart for details).  Clinical Course as of Mar 30 2318  Wynelle Link Mar 30, 2020  1746 Patient's CT scan is concerning for cholelithiasis.  Right upper quadrant ultrasound ordered.  On repeat evaluation, reports that his pain continues to improve.   [AN]    Clinical Course User Index [AN] Derwood Kaplan, MD   MDM Rules/Calculators/A&P                          49 year old male comes in a chief complaint of abdominal pain.  Patient is noted to have periumbilical abdominal tenderness without any rebound or guarding.  Negative Murphy's and negative McBurney's.  Differential diagnosis includes gas, colonic dysmotility, constipation, ileus, SBO.  Labs reveal elevated white count.  CT ordered.  Final Clinical Impression(s) / ED Diagnoses Final diagnoses:  Gallstones  Generalized abdominal pain    Rx / DC Orders ED Discharge Orders    None       Derwood Kaplan, MD 03/30/20 2319

## 2020-03-30 NOTE — Discharge Instructions (Signed)
The work-up in the ER shows that you have gallstones.  CT scan is not revealing any evidence of small bowel obstruction or internal infection.  It is possible that the change in your diet might have caused abdominal spasms. We recommend clear liquid diet for the next 2 days.  Given that the ultrasound does show gallbladder sludge and gallstones, it might be a good idea to see the surgeon to see if you want to proceed with gallbladder removal.

## 2020-04-14 ENCOUNTER — Other Ambulatory Visit: Payer: Self-pay | Admitting: Family Medicine

## 2020-04-14 DIAGNOSIS — K8020 Calculus of gallbladder without cholecystitis without obstruction: Secondary | ICD-10-CM | POA: Diagnosis not present

## 2020-04-14 DIAGNOSIS — R1031 Right lower quadrant pain: Secondary | ICD-10-CM

## 2020-04-17 DIAGNOSIS — K8020 Calculus of gallbladder without cholecystitis without obstruction: Secondary | ICD-10-CM | POA: Diagnosis not present

## 2020-05-01 DIAGNOSIS — I10 Essential (primary) hypertension: Secondary | ICD-10-CM | POA: Diagnosis not present

## 2020-06-16 DIAGNOSIS — E119 Type 2 diabetes mellitus without complications: Secondary | ICD-10-CM | POA: Diagnosis not present

## 2020-06-16 DIAGNOSIS — I10 Essential (primary) hypertension: Secondary | ICD-10-CM | POA: Diagnosis not present

## 2020-06-16 DIAGNOSIS — E785 Hyperlipidemia, unspecified: Secondary | ICD-10-CM | POA: Diagnosis not present

## 2020-06-19 DIAGNOSIS — I10 Essential (primary) hypertension: Secondary | ICD-10-CM | POA: Diagnosis not present

## 2020-06-19 DIAGNOSIS — E785 Hyperlipidemia, unspecified: Secondary | ICD-10-CM | POA: Diagnosis not present

## 2020-06-19 DIAGNOSIS — F3342 Major depressive disorder, recurrent, in full remission: Secondary | ICD-10-CM | POA: Diagnosis not present

## 2020-06-19 DIAGNOSIS — E119 Type 2 diabetes mellitus without complications: Secondary | ICD-10-CM | POA: Diagnosis not present

## 2021-02-06 DIAGNOSIS — K219 Gastro-esophageal reflux disease without esophagitis: Secondary | ICD-10-CM | POA: Diagnosis not present

## 2021-02-06 DIAGNOSIS — E1169 Type 2 diabetes mellitus with other specified complication: Secondary | ICD-10-CM | POA: Diagnosis not present

## 2021-02-06 DIAGNOSIS — I10 Essential (primary) hypertension: Secondary | ICD-10-CM | POA: Diagnosis not present

## 2021-02-06 DIAGNOSIS — Z23 Encounter for immunization: Secondary | ICD-10-CM | POA: Diagnosis not present

## 2021-02-13 DIAGNOSIS — I10 Essential (primary) hypertension: Secondary | ICD-10-CM | POA: Diagnosis not present

## 2021-02-24 DIAGNOSIS — K219 Gastro-esophageal reflux disease without esophagitis: Secondary | ICD-10-CM | POA: Diagnosis not present

## 2021-02-24 DIAGNOSIS — I10 Essential (primary) hypertension: Secondary | ICD-10-CM | POA: Diagnosis not present

## 2021-02-24 DIAGNOSIS — F419 Anxiety disorder, unspecified: Secondary | ICD-10-CM | POA: Diagnosis not present

## 2021-02-24 DIAGNOSIS — G4700 Insomnia, unspecified: Secondary | ICD-10-CM | POA: Diagnosis not present

## 2021-03-03 DIAGNOSIS — Z0000 Encounter for general adult medical examination without abnormal findings: Secondary | ICD-10-CM | POA: Diagnosis not present

## 2021-03-03 DIAGNOSIS — Z13220 Encounter for screening for lipoid disorders: Secondary | ICD-10-CM | POA: Diagnosis not present

## 2021-03-03 DIAGNOSIS — E785 Hyperlipidemia, unspecified: Secondary | ICD-10-CM | POA: Diagnosis not present

## 2021-03-03 DIAGNOSIS — Z125 Encounter for screening for malignant neoplasm of prostate: Secondary | ICD-10-CM | POA: Diagnosis not present

## 2021-04-30 DIAGNOSIS — G4700 Insomnia, unspecified: Secondary | ICD-10-CM | POA: Diagnosis not present

## 2021-04-30 DIAGNOSIS — F419 Anxiety disorder, unspecified: Secondary | ICD-10-CM | POA: Diagnosis not present

## 2021-04-30 DIAGNOSIS — I10 Essential (primary) hypertension: Secondary | ICD-10-CM | POA: Diagnosis not present

## 2021-05-27 DIAGNOSIS — I10 Essential (primary) hypertension: Secondary | ICD-10-CM | POA: Diagnosis not present

## 2021-06-11 DIAGNOSIS — I10 Essential (primary) hypertension: Secondary | ICD-10-CM | POA: Diagnosis not present

## 2021-06-11 DIAGNOSIS — R070 Pain in throat: Secondary | ICD-10-CM | POA: Diagnosis not present

## 2021-07-14 DIAGNOSIS — R413 Other amnesia: Secondary | ICD-10-CM | POA: Diagnosis not present

## 2021-07-14 DIAGNOSIS — M542 Cervicalgia: Secondary | ICD-10-CM | POA: Diagnosis not present

## 2021-07-14 DIAGNOSIS — E1169 Type 2 diabetes mellitus with other specified complication: Secondary | ICD-10-CM | POA: Diagnosis not present

## 2021-07-14 DIAGNOSIS — I10 Essential (primary) hypertension: Secondary | ICD-10-CM | POA: Diagnosis not present

## 2021-07-30 DIAGNOSIS — M5481 Occipital neuralgia: Secondary | ICD-10-CM | POA: Diagnosis not present

## 2021-08-20 DIAGNOSIS — R198 Other specified symptoms and signs involving the digestive system and abdomen: Secondary | ICD-10-CM | POA: Diagnosis not present

## 2021-08-20 DIAGNOSIS — K219 Gastro-esophageal reflux disease without esophagitis: Secondary | ICD-10-CM | POA: Insufficient documentation

## 2021-08-24 DIAGNOSIS — R410 Disorientation, unspecified: Secondary | ICD-10-CM | POA: Diagnosis not present

## 2021-08-24 DIAGNOSIS — R413 Other amnesia: Secondary | ICD-10-CM | POA: Diagnosis not present

## 2021-08-24 DIAGNOSIS — G44309 Post-traumatic headache, unspecified, not intractable: Secondary | ICD-10-CM | POA: Diagnosis not present

## 2021-08-24 DIAGNOSIS — S0990XA Unspecified injury of head, initial encounter: Secondary | ICD-10-CM | POA: Diagnosis not present

## 2021-09-02 DIAGNOSIS — D3610 Benign neoplasm of peripheral nerves and autonomic nervous system, unspecified: Secondary | ICD-10-CM | POA: Diagnosis not present

## 2021-09-02 DIAGNOSIS — M5481 Occipital neuralgia: Secondary | ICD-10-CM | POA: Diagnosis not present

## 2021-09-17 DIAGNOSIS — M5481 Occipital neuralgia: Secondary | ICD-10-CM | POA: Diagnosis not present

## 2021-09-22 DIAGNOSIS — M5481 Occipital neuralgia: Secondary | ICD-10-CM | POA: Insufficient documentation

## 2021-10-23 DIAGNOSIS — E785 Hyperlipidemia, unspecified: Secondary | ICD-10-CM | POA: Diagnosis not present

## 2021-10-23 DIAGNOSIS — E119 Type 2 diabetes mellitus without complications: Secondary | ICD-10-CM | POA: Diagnosis not present

## 2021-10-23 DIAGNOSIS — G4700 Insomnia, unspecified: Secondary | ICD-10-CM | POA: Diagnosis not present

## 2021-10-23 DIAGNOSIS — I10 Essential (primary) hypertension: Secondary | ICD-10-CM | POA: Diagnosis not present

## 2021-10-24 DIAGNOSIS — R519 Headache, unspecified: Secondary | ICD-10-CM | POA: Diagnosis not present

## 2021-10-24 DIAGNOSIS — R531 Weakness: Secondary | ICD-10-CM | POA: Diagnosis not present

## 2021-10-24 DIAGNOSIS — R200 Anesthesia of skin: Secondary | ICD-10-CM | POA: Diagnosis not present

## 2021-10-24 DIAGNOSIS — R9431 Abnormal electrocardiogram [ECG] [EKG]: Secondary | ICD-10-CM | POA: Diagnosis not present

## 2021-10-24 DIAGNOSIS — I160 Hypertensive urgency: Secondary | ICD-10-CM | POA: Diagnosis not present

## 2021-10-24 DIAGNOSIS — R002 Palpitations: Secondary | ICD-10-CM | POA: Diagnosis not present

## 2021-11-16 DIAGNOSIS — M5481 Occipital neuralgia: Secondary | ICD-10-CM | POA: Diagnosis not present

## 2021-11-16 DIAGNOSIS — Z01818 Encounter for other preprocedural examination: Secondary | ICD-10-CM | POA: Diagnosis not present

## 2021-11-16 DIAGNOSIS — R7303 Prediabetes: Secondary | ICD-10-CM | POA: Diagnosis not present

## 2021-11-16 DIAGNOSIS — I10 Essential (primary) hypertension: Secondary | ICD-10-CM | POA: Diagnosis not present

## 2021-11-16 DIAGNOSIS — E876 Hypokalemia: Secondary | ICD-10-CM | POA: Diagnosis not present

## 2021-12-14 DIAGNOSIS — M5481 Occipital neuralgia: Secondary | ICD-10-CM | POA: Diagnosis not present

## 2021-12-14 DIAGNOSIS — I10 Essential (primary) hypertension: Secondary | ICD-10-CM | POA: Diagnosis not present

## 2021-12-14 DIAGNOSIS — Z9889 Other specified postprocedural states: Secondary | ICD-10-CM | POA: Diagnosis not present

## 2022-03-08 DIAGNOSIS — Z23 Encounter for immunization: Secondary | ICD-10-CM | POA: Diagnosis not present

## 2022-03-08 DIAGNOSIS — Z125 Encounter for screening for malignant neoplasm of prostate: Secondary | ICD-10-CM | POA: Diagnosis not present

## 2022-03-08 DIAGNOSIS — I10 Essential (primary) hypertension: Secondary | ICD-10-CM | POA: Diagnosis not present

## 2022-03-08 DIAGNOSIS — E119 Type 2 diabetes mellitus without complications: Secondary | ICD-10-CM | POA: Diagnosis not present

## 2022-03-08 DIAGNOSIS — E785 Hyperlipidemia, unspecified: Secondary | ICD-10-CM | POA: Diagnosis not present

## 2022-03-08 DIAGNOSIS — R002 Palpitations: Secondary | ICD-10-CM | POA: Diagnosis not present

## 2022-03-08 DIAGNOSIS — Z0000 Encounter for general adult medical examination without abnormal findings: Secondary | ICD-10-CM | POA: Diagnosis not present

## 2022-03-09 DIAGNOSIS — E119 Type 2 diabetes mellitus without complications: Secondary | ICD-10-CM | POA: Diagnosis not present

## 2022-03-17 DIAGNOSIS — H5213 Myopia, bilateral: Secondary | ICD-10-CM | POA: Diagnosis not present

## 2022-03-17 DIAGNOSIS — H31003 Unspecified chorioretinal scars, bilateral: Secondary | ICD-10-CM | POA: Diagnosis not present

## 2022-04-06 ENCOUNTER — Encounter: Payer: Self-pay | Admitting: Interventional Cardiology

## 2022-04-06 ENCOUNTER — Ambulatory Visit: Payer: BC Managed Care – PPO | Attending: Interventional Cardiology | Admitting: Interventional Cardiology

## 2022-04-06 VITALS — BP 126/82 | HR 80 | Ht 68.0 in | Wt 180.2 lb

## 2022-04-06 DIAGNOSIS — R002 Palpitations: Secondary | ICD-10-CM | POA: Diagnosis not present

## 2022-04-06 DIAGNOSIS — E782 Mixed hyperlipidemia: Secondary | ICD-10-CM

## 2022-04-06 DIAGNOSIS — I10 Essential (primary) hypertension: Secondary | ICD-10-CM

## 2022-04-06 NOTE — Progress Notes (Signed)
Cardiology Office Note   Date:  04/06/2022   ID:  Maxwell Richards, DOB 1971-01-19, MRN [id number]  PCP:  Shirlean Mylar, MD    No chief complaint on file.  palpitations  Wt Readings from Last 3 Encounters:  04/06/22 180 lb 3.2 oz (81.7 kg)  03/30/20 186 lb 8 oz (84.6 kg)  04/13/17 180 lb (81.6 kg)       History of Present Illness: Maxwell Richards is a 51 y.o. male who is being seen today for the evaluation of palpitations at the request of Shirlean Mylar, MD.   He has had palpitations or years.   He has sx of his HR increased.  It only happens during sleep, rarely wakes him up from sleep.  It can happen when drowsy as well during the day.   He has been drinking more caffeine of late; coffee, sweet tea.  Palpitations worse with anxiety as well.  In June 2023, episode of headache, palpitations, arm numbness which was related to high blood pressure and possibly anxiety.  All  He had a high amount of caffeine at that time.   CTA head in 6/23: "CTA of head and neck with 75 cc Isovue-370. Axial, coronal and sagittal MIPs with three-dimensional reconstructions.  COMPARISON: None  FINDINGS:  Aortic arch: Normal  Right carotid: Normal  Right vertebral: Normal  Left carotid: Normal  Left vertebral: Normal  Right anterior circulation: The right internal carotid artery, right middle cerebral artery and right anterior cerebral artery territories are patent. There is no large vessel occlusion, stenosis or aneurysm.   Left anterior circulation: The left internal carotid artery, left middle cerebral artery and left anterior cerebral artery territories are patent. There is no large vessel occlusion, stenosis or aneurysm.   Vertebrobasilar circulation: The vertebral and basilar arteries are patent. The posterior cerebral arteries are patent. There is no large vessel occlusion, stenosis or aneurysm.  Dural sinuses:Normal  Impression:  IMPRESSION:   Normal "  He works as a Engineer, civil (consulting) at  [employer] On the neurology floor, getting plenty of steps.  No other dedicated exercise.  Denies : Chest pain. Dizziness. Leg edema. Nitroglycerin use. Orthopnea.  Paroxysmal nocturnal dyspnea. Shortness of breath. Syncope.    Palpitations were more Prominent over a month ago.  In the last 4 weeks, he has not really had any symptoms.  Past Medical History:  Diagnosis Date   Abdominal pain    Aldosteronism (HCC)    Angina at rest    Anxiety    Chronic headaches    DM (diabetes mellitus) (HCC)    History of OCD (obsessive compulsive disorder)    Hyperlipidemia    Hypertension    Insomnia    Palpitations    Panic attack     Past Surgical History:  Procedure Laterality Date   NASAL SINUS SURGERY  2006     Current Outpatient Medications  Medication Sig Dispense Refill   amLODipine (NORVASC) 5 MG tablet Take 5 mg by mouth daily.     cyclobenzaprine (FLEXERIL) 5 MG tablet Take 5 mg by mouth at bedtime as needed.     Docusate Sodium (DSS) 100 MG CAPS Take by mouth.     eplerenone (INSPRA) 25 MG tablet      gabapentin (NEURONTIN) 300 MG capsule Take by mouth.     LORazepam (ATIVAN) 1 MG tablet Take 1 mg by mouth as needed for anxiety.     PARoxetine (PAXIL) 10 MG tablet Take 40 mg by  mouth daily.     hydrochlorothiazide (MICROZIDE) 12.5 MG capsule Take 12.5 mg by mouth daily. (Patient not taking: Reported on 04/06/2022)  4   omeprazole (PRILOSEC) 20 MG capsule TAKE 2 CAPSULES BY MOUTH DAILY 30 MINUTES BEFORE MORNING MEAL AS NEEDED     pravastatin (PRAVACHOL) 10 MG tablet Take 10 mg by mouth at bedtime.     valsartan (DIOVAN) 320 MG tablet Take 320 mg by mouth daily.     zolpidem (AMBIEN) 10 MG tablet Take 10 mg by mouth at bedtime as needed.     No current facility-administered medications for this visit.    Allergies:   Cyclobenzaprine, Lisinopril, Other, and Robaxin [methocarbamol]    Social History:  The patient  reports that he has never smoked. He has never used smokeless  tobacco. He reports current alcohol use. He reports that he does not use drugs.   Family History:  The patient's family history includes Hypertension in his mother, paternal grandfather, and paternal grandmother; Supraventricular tachycardia in his father. Three sisters are healthy.  No early CAD.    ROS:  Please see the history of present illness.   Otherwise, review of systems are positive for palpitations.   All other systems are reviewed and negative.    PHYSICAL EXAM: VS:  BP 126/82   Pulse 80   Ht 5\' 8"  (1.727 m)   Wt 180 lb 3.2 oz (81.7 kg)   SpO2 95%   BMI 27.40 kg/m  , BMI Body mass index is 27.4 kg/m. GEN: Well nourished, well developed, in no acute distress HEENT: normal Neck: no JVD, carotid bruits, or masses Cardiac: RRR; no murmurs, rubs, or gallops,no edema  Respiratory:  clear to auscultation bilaterally, normal work of breathing GI: soft, nontender, nondistended, + BS MS: no deformity or atrophy Skin: warm and dry, no rash Neuro:  Strength and sensation are intact Psych: euthymic mood, full affect   EKG:   The ekg ordered today demonstrates normal sinus rhythm   Recent Labs: No results found for requested labs within last 365 days.   Lipid Panel No results found for: "CHOL", "TRIG", "HDL", "CHOLHDL", "VLDL", "LDLCALC", "LDLDIRECT"   Other studies Reviewed: Additional studies/ records that were reviewed today with results demonstrating: prior ER visit at [facility] noted.  2014 normal stress echo.   ASSESSMENT AND PLAN:  Palpitations: No sx in the past month.  No syncope.  No high risk features to the palpitations.  Increase hydration.  If the symptoms are not improved after cutting back on caffeine, could reconsider heart monitor. Anxiety: tooks meds in the past, like Ativan- this was stopped. Decrease caffeine.  Worse if he stops Paxil.  HTN: The current medical regimen is effective;  continue present plan and medications. Hyperlipidemia: LDL 103.  Whole  food, plant-based diet.  Avoid processed foods.  Eat high-fiber diet.   Current medicines are reviewed at length with the patient today.  The patient concerns regarding his medicines were addressed.  The following changes have been made:  No change  Labs/ tests ordered today include:  No orders of the defined types were placed in this encounter.   Recommend 150 minutes/week of aerobic exercise Low fat, low carb, high fiber diet recommended  Disposition:   FU as needed   Signed, [facility], MD  04/06/2022 11:22 AM    [facility] [address] [place], [place], [place]  [place] Phone: [phone]; Fax: [phone]

## 2022-04-06 NOTE — Patient Instructions (Signed)
Medication Instructions:  Your physician recommends that you continue on your current medications as directed. Please refer to the Current Medication list given to you today.  *If you need a refill on your cardiac medications before your next appointment, please call your pharmacy*   Lab Work: None  If you have labs (blood work) drawn today and your tests are completely normal, you will receive your results only by: MyChart Message (if you have MyChart) OR A paper copy in the mail If you have any lab test that is abnormal or we need to change your treatment, we will call you to review the results.   Testing/Procedures: None   Follow-Up: As needed   Increase your water intake  Important Information About Sugar

## 2022-05-21 DIAGNOSIS — I10 Essential (primary) hypertension: Secondary | ICD-10-CM | POA: Diagnosis not present

## 2022-05-21 DIAGNOSIS — K219 Gastro-esophageal reflux disease without esophagitis: Secondary | ICD-10-CM | POA: Diagnosis not present

## 2022-09-07 DIAGNOSIS — I10 Essential (primary) hypertension: Secondary | ICD-10-CM | POA: Diagnosis not present

## 2022-09-07 DIAGNOSIS — R7303 Prediabetes: Secondary | ICD-10-CM | POA: Diagnosis not present

## 2022-09-07 DIAGNOSIS — E785 Hyperlipidemia, unspecified: Secondary | ICD-10-CM | POA: Diagnosis not present

## 2022-09-07 DIAGNOSIS — G8929 Other chronic pain: Secondary | ICD-10-CM | POA: Diagnosis not present

## 2022-09-07 DIAGNOSIS — G4700 Insomnia, unspecified: Secondary | ICD-10-CM | POA: Diagnosis not present

## 2022-11-05 DIAGNOSIS — Z1330 Encounter for screening examination for mental health and behavioral disorders, unspecified: Secondary | ICD-10-CM | POA: Diagnosis not present

## 2022-11-05 DIAGNOSIS — R519 Headache, unspecified: Secondary | ICD-10-CM | POA: Diagnosis not present

## 2022-11-18 DIAGNOSIS — D124 Benign neoplasm of descending colon: Secondary | ICD-10-CM | POA: Diagnosis not present

## 2022-11-18 DIAGNOSIS — K648 Other hemorrhoids: Secondary | ICD-10-CM | POA: Diagnosis not present

## 2022-11-18 DIAGNOSIS — Z83719 Family history of colon polyps, unspecified: Secondary | ICD-10-CM | POA: Diagnosis not present

## 2022-11-18 DIAGNOSIS — Z1211 Encounter for screening for malignant neoplasm of colon: Secondary | ICD-10-CM | POA: Diagnosis not present

## 2022-12-23 DIAGNOSIS — L905 Scar conditions and fibrosis of skin: Secondary | ICD-10-CM | POA: Diagnosis not present

## 2022-12-23 DIAGNOSIS — R519 Headache, unspecified: Secondary | ICD-10-CM | POA: Diagnosis not present

## 2022-12-24 DIAGNOSIS — R519 Headache, unspecified: Secondary | ICD-10-CM | POA: Insufficient documentation

## 2022-12-24 DIAGNOSIS — L905 Scar conditions and fibrosis of skin: Secondary | ICD-10-CM | POA: Insufficient documentation

## 2023-02-23 DIAGNOSIS — R519 Headache, unspecified: Secondary | ICD-10-CM | POA: Diagnosis not present

## 2023-03-14 DIAGNOSIS — E119 Type 2 diabetes mellitus without complications: Secondary | ICD-10-CM | POA: Diagnosis not present

## 2023-03-14 DIAGNOSIS — E785 Hyperlipidemia, unspecified: Secondary | ICD-10-CM | POA: Diagnosis not present

## 2023-03-14 DIAGNOSIS — Z125 Encounter for screening for malignant neoplasm of prostate: Secondary | ICD-10-CM | POA: Diagnosis not present

## 2023-03-14 DIAGNOSIS — I10 Essential (primary) hypertension: Secondary | ICD-10-CM | POA: Diagnosis not present

## 2023-03-14 DIAGNOSIS — Z0000 Encounter for general adult medical examination without abnormal findings: Secondary | ICD-10-CM | POA: Diagnosis not present

## 2023-03-14 DIAGNOSIS — F3342 Major depressive disorder, recurrent, in full remission: Secondary | ICD-10-CM | POA: Diagnosis not present

## 2023-04-27 DIAGNOSIS — E785 Hyperlipidemia, unspecified: Secondary | ICD-10-CM | POA: Diagnosis not present

## 2023-04-27 DIAGNOSIS — I10 Essential (primary) hypertension: Secondary | ICD-10-CM | POA: Diagnosis not present

## 2023-04-27 DIAGNOSIS — E119 Type 2 diabetes mellitus without complications: Secondary | ICD-10-CM | POA: Diagnosis not present

## 2023-05-17 DIAGNOSIS — B009 Herpesviral infection, unspecified: Secondary | ICD-10-CM | POA: Diagnosis not present

## 2023-05-17 DIAGNOSIS — R102 Pelvic and perineal pain: Secondary | ICD-10-CM | POA: Diagnosis not present

## 2023-05-23 DIAGNOSIS — I10 Essential (primary) hypertension: Secondary | ICD-10-CM | POA: Diagnosis not present

## 2023-05-23 DIAGNOSIS — L905 Scar conditions and fibrosis of skin: Secondary | ICD-10-CM | POA: Diagnosis not present

## 2023-05-23 DIAGNOSIS — M5481 Occipital neuralgia: Secondary | ICD-10-CM | POA: Diagnosis not present

## 2023-05-23 DIAGNOSIS — R519 Headache, unspecified: Secondary | ICD-10-CM | POA: Diagnosis not present

## 2023-06-06 DIAGNOSIS — B0089 Other herpesviral infection: Secondary | ICD-10-CM | POA: Diagnosis not present

## 2023-06-06 DIAGNOSIS — R102 Pelvic and perineal pain: Secondary | ICD-10-CM | POA: Diagnosis not present

## 2023-06-08 DIAGNOSIS — T148XXA Other injury of unspecified body region, initial encounter: Secondary | ICD-10-CM | POA: Diagnosis not present

## 2023-06-08 DIAGNOSIS — Z9889 Other specified postprocedural states: Secondary | ICD-10-CM | POA: Diagnosis not present

## 2023-06-08 DIAGNOSIS — R109 Unspecified abdominal pain: Secondary | ICD-10-CM | POA: Diagnosis not present

## 2023-07-05 DIAGNOSIS — H31003 Unspecified chorioretinal scars, bilateral: Secondary | ICD-10-CM | POA: Diagnosis not present

## 2023-07-05 DIAGNOSIS — H5213 Myopia, bilateral: Secondary | ICD-10-CM | POA: Diagnosis not present

## 2023-07-14 DIAGNOSIS — U071 COVID-19: Secondary | ICD-10-CM | POA: Diagnosis not present

## 2023-11-23 DIAGNOSIS — H04123 Dry eye syndrome of bilateral lacrimal glands: Secondary | ICD-10-CM | POA: Diagnosis not present

## 2023-11-23 DIAGNOSIS — H18833 Recurrent erosion of cornea, bilateral: Secondary | ICD-10-CM | POA: Diagnosis not present

## 2023-12-07 DIAGNOSIS — Z9889 Other specified postprocedural states: Secondary | ICD-10-CM | POA: Diagnosis not present

## 2023-12-07 DIAGNOSIS — G8929 Other chronic pain: Secondary | ICD-10-CM | POA: Diagnosis not present

## 2023-12-07 DIAGNOSIS — I10 Essential (primary) hypertension: Secondary | ICD-10-CM | POA: Diagnosis not present

## 2024-01-02 DIAGNOSIS — H1820 Unspecified corneal edema: Secondary | ICD-10-CM | POA: Diagnosis not present

## 2024-01-02 DIAGNOSIS — H16103 Unspecified superficial keratitis, bilateral: Secondary | ICD-10-CM | POA: Diagnosis not present

## 2024-01-16 DIAGNOSIS — H1820 Unspecified corneal edema: Secondary | ICD-10-CM | POA: Diagnosis not present

## 2024-01-16 DIAGNOSIS — H04123 Dry eye syndrome of bilateral lacrimal glands: Secondary | ICD-10-CM | POA: Diagnosis not present

## 2024-02-03 DIAGNOSIS — J029 Acute pharyngitis, unspecified: Secondary | ICD-10-CM | POA: Diagnosis not present

## 2024-02-06 ENCOUNTER — Encounter: Payer: Self-pay | Admitting: Pain Medicine

## 2024-02-06 ENCOUNTER — Ambulatory Visit: Attending: Pain Medicine | Admitting: Pain Medicine

## 2024-02-06 VITALS — BP 149/96 | HR 65 | Temp 98.6°F | Resp 16 | Ht 67.5 in | Wt 190.4 lb

## 2024-02-06 DIAGNOSIS — F410 Panic disorder [episodic paroxysmal anxiety] without agoraphobia: Secondary | ICD-10-CM | POA: Insufficient documentation

## 2024-02-06 DIAGNOSIS — E2609 Other primary hyperaldosteronism: Secondary | ICD-10-CM | POA: Insufficient documentation

## 2024-02-06 DIAGNOSIS — M549 Dorsalgia, unspecified: Secondary | ICD-10-CM | POA: Insufficient documentation

## 2024-02-06 DIAGNOSIS — M542 Cervicalgia: Secondary | ICD-10-CM | POA: Diagnosis not present

## 2024-02-06 DIAGNOSIS — N411 Chronic prostatitis: Secondary | ICD-10-CM | POA: Insufficient documentation

## 2024-02-06 DIAGNOSIS — E1169 Type 2 diabetes mellitus with other specified complication: Secondary | ICD-10-CM | POA: Insufficient documentation

## 2024-02-06 DIAGNOSIS — J309 Allergic rhinitis, unspecified: Secondary | ICD-10-CM | POA: Insufficient documentation

## 2024-02-06 DIAGNOSIS — M25511 Pain in right shoulder: Secondary | ICD-10-CM | POA: Insufficient documentation

## 2024-02-06 DIAGNOSIS — G8929 Other chronic pain: Secondary | ICD-10-CM | POA: Diagnosis not present

## 2024-02-06 DIAGNOSIS — R519 Headache, unspecified: Secondary | ICD-10-CM | POA: Diagnosis not present

## 2024-02-06 DIAGNOSIS — G4486 Cervicogenic headache: Secondary | ICD-10-CM | POA: Insufficient documentation

## 2024-02-06 DIAGNOSIS — M5481 Occipital neuralgia: Secondary | ICD-10-CM | POA: Diagnosis not present

## 2024-02-06 DIAGNOSIS — F419 Anxiety disorder, unspecified: Secondary | ICD-10-CM | POA: Insufficient documentation

## 2024-02-06 DIAGNOSIS — I700 Atherosclerosis of aorta: Secondary | ICD-10-CM | POA: Insufficient documentation

## 2024-02-06 DIAGNOSIS — M47812 Spondylosis without myelopathy or radiculopathy, cervical region: Secondary | ICD-10-CM | POA: Diagnosis not present

## 2024-02-06 DIAGNOSIS — R768 Other specified abnormal immunological findings in serum: Secondary | ICD-10-CM | POA: Insufficient documentation

## 2024-02-06 DIAGNOSIS — I10 Essential (primary) hypertension: Secondary | ICD-10-CM | POA: Insufficient documentation

## 2024-02-06 DIAGNOSIS — M25512 Pain in left shoulder: Secondary | ICD-10-CM | POA: Insufficient documentation

## 2024-02-06 DIAGNOSIS — F3342 Major depressive disorder, recurrent, in full remission: Secondary | ICD-10-CM | POA: Insufficient documentation

## 2024-02-06 DIAGNOSIS — E785 Hyperlipidemia, unspecified: Secondary | ICD-10-CM | POA: Insufficient documentation

## 2024-02-06 DIAGNOSIS — G4700 Insomnia, unspecified: Secondary | ICD-10-CM | POA: Insufficient documentation

## 2024-02-06 DIAGNOSIS — N200 Calculus of kidney: Secondary | ICD-10-CM | POA: Insufficient documentation

## 2024-02-06 DIAGNOSIS — Z9889 Other specified postprocedural states: Secondary | ICD-10-CM | POA: Insufficient documentation

## 2024-02-06 DIAGNOSIS — K219 Gastro-esophageal reflux disease without esophagitis: Secondary | ICD-10-CM | POA: Insufficient documentation

## 2024-02-06 DIAGNOSIS — K8020 Calculus of gallbladder without cholecystitis without obstruction: Secondary | ICD-10-CM | POA: Insufficient documentation

## 2024-02-06 NOTE — Patient Instructions (Addendum)
 ______________________________________________________________________    TENS (Device can be purchased online, without prescription. Search: TENS 7000.) Transcutaneous electrical nerve stimulation (TENS) is a method of pain relief that involves the use of mild electrical stimulation. A TENS machine is a small, battery-operated device that has leads connected to sticky pads called electrodes. Available at [employer]. (Estimated price as of July 10th, 2025.)  (Estimated [employer] cost: $38.88) Rechargeable 9V batteries:  (Estimated [employer] cost: $12.98)  Larger Reusable 2 x 4 TENS Pads/Electrodes:  (Estimated [employer] cost: $9.99)  Total cost: $61.85    ELECTRODE PLACEMENT:   TENS UNIT SAFETY WARNING SHEET and INFORMATION INDICATIONS AND CONTRAINDICTIONS Read the operation manual before using the device. Freight forwarder ([place] ) restricts this device to sale by or on the order of a physician. Observe your physician's precise instructions and let him show you where to apply the electrodes. For a successful therapy, the correct application of the electrodes is an important factor. Carefully write down the settings your physician recommended. Indications for use This device is a prescription device and only for symptomatic relief of chronic intractable pain.  Contraindications:   Any electrode placement that applies current to the carotid sinus (neck) region.   Patients with implanted electronic devices (for example, a pacemaker) or metallic implants should not undertake.   Any electrode placement that causes current to flow transcerebrally (through the head). The use of unit whenever pain symptoms are undiagnosed, unit etiology is determined.   The use of TENS whenever pain syndromes are undiagnosed, until etiology is established.   WARNINGS AND PRECAUTIONS  Warnings:   The device must be kept out of reach of children.   The safety of device for use during pregnancy or delivery has not been established.    Do not place electrodes on front of the throat. This may result in spasms of the laryngeal and pharyngeal muscles.   Do not place the electrodes over the carotid nerve (side of neck below ear).   The device is not effective for pain of central origin (headaches).   The device may interfere with electronic monitoring equipment (such as ECG monitors and ECG alarms).   Electrodes should not be placed over the eyes, in the mouth, or internally.   These devices have no curative value.   TENS devices should be used only under the continued supervision of a physician.   TENS is a symptomatic treatment and as such suppresses the sensation of pain which would otherwise serve as a protective mechanism. Precautions/Adverse Reactions   Isolated cases of skin irritation may occur at the site of electrode placement following long-term application.   Stimulation should be stopped and electrodes removed until the cause of the irritation can be determined.   Effectiveness is highly dependent upon patient selection by a person qualified in the management of pain patients.   If the device treatment becomes ineffective or unpleasant, stimulation should be discontinued until reevaluation by a physician/clinician.   Always turn the device off before applying or removing electrodes.   Skin irritation and electrode burns are potential adverse reactions.  PURPOSE: A Transcutaneous Electrical Nerve Stimulator, or TENS, unit is designed to relieve post-operative, acute and chronic pain. It is used for pain caused by peripheral nerves and not central. TENS units are prescription-only devices.   OPERATION: TENS units work in a couple of ways. The first way they are thought to work is by a method called the [employer]. The [employer] states that our brains can only handle one  stimulus at a time. When you have chronic pain, this pain signal is constantly being sent to your brain and recognized as pain. When an electrical stimulus is added  to the area of pain the body feels this electrical stimulus, and since the brain can only handle one thing at a time, the pain is not transmitted to the brain. The second method thought to be part of TENS unit's success is by way of stimulating our own bodies to release their own natural painkillers. TENS units do not work for everyone and results may vary. Always follow the instructions and warnings in your user's manual.   USE: One of the most important tasks that must be performed is battery maintenance. If you are using a [employer] ge[employer], always fully charge it and fully deplete it before charging it again. These batteries can develop memories and by not performing this charging task correctly, your battery's life can be greatly diminished. If your battery does develop a memory you can help expand the memory by charging for 12 - 13 hours and then completely depleting the battery. Always prepare the skin before applying electrodes. Your skin should be clean and free of any lotions or creams. If you are using electrodes that use conductive gel, apply a small, even layer over the electrode. For carbon, self-adhesive electrodes, apply a drop of water to the electrodes before applying to the skin. The electrodes attach to the lead wires and then the TENS unit. Always grasp the connector and not the cord when inserting or removing. When making adjustments, always make sure the unit's channels (1 and 2) are in the OFF position. The actual settings should be recommended and prescribed by your physician. Medical equipment suppliers don'tset or instruct users as to user settings. When you are using the BURST mode, the unit delivers a series of quick pulses followed by a rest. This cycle repeats itself frequently. Always have channels OFF before changing modes.   For MODULATION mode, the stimulation automatically varies the width of the pulse.   For CONVENTIONAL mode, the stimulation is constant.  After the settings have been fine-tuned, set the timer to 30 or 60 minutes. Your physician should also prescribe the use time. When the lights become dim, it means your batteries should be replaced or recharged.   ACCESSORIES: The electrodes and lead wires can be obtained from your medical equipment supplier. Your medical equipment supplier can set up a recurring delivery to accommodate your needs. Electrodes should be replaced once a month and lead wires once every 6 months.  Video Tutorial [email or web address]://[email or web address].be/[email or web address]  ______________________________________________________________________      ______________________________________________________________________    New Patients  Welcome to [facility].   Initial Visit The first or initial visit consists of an evaluation only.   Interventional pain management.  This is an interventional pain management medical practice. This means that we offer therapies other than prescription controlled substances to manage chronic pain. These therapies may include, but are not limited to, diagnostic, therapeutic, and palliative specialized injection therapies (i.e.: Epidural Steroids, Facet Blocks, etc.). We specialize in a variety of nerve blocks as well as radiofrequency treatments. We offer pain implant evaluations and trials, as well as follow up management. In addition we also provide a variety joint injections, including Viscosupplementation (AKA: Gel Therapy).  Prescription Pain Medication. We specialize in alternatives to opioids.  Although we can provide evaluations and recommendations for/of pharmacologic therapies based on CDC Guidelines, we  no longer take patients for long-term medication management. Please be clear that we will not be taking over the prescribing of your pain medications.  ______________________________________________________________________       ______________________________________________________________________    Patient Information update  To: All of our patients.  Re: Name change.  It has been made official that our current name, "[facility]"   will soon be changed to "[facility]".   The purpose of this change is to eliminate any confusion created by the concept of our practice being a "Medication Management Pain Clinic". In the past this has led to the misconception that we treat pain primarily by the use of prescription medications.  Nothing can be farther from the truth.   Understanding PAIN MANAGEMENT: To further understand what our practice does, you first have to understand that "Pain Management" is a subspecialty that requires additional training once a physician has completed their specialty training, which can be in either Anesthesia, Neurology, Psychiatry, or Physical Medicine and Rehabilitation (PMR). Each one of these contributes to the final approach taken by each physician to the management of their patient's pain. To be a "Pain Management Specialist" you must have first completed one of the specialty trainings below.  Anesthesiologists - trained in clinical pharmacology and interventional techniques such as nerve blockade and regional as well as central neuroanatomy. They are trained to block pain before, during, and after surgical interventions.  Neurologists - trained in the diagnosis and pharmacological treatment of complex neurological conditions, such as Multiple Sclerosis, Parkinson's, spinal cord injuries, and other systemic conditions that may be associated with symptoms that may include but are not limited to pain. They tend to rely primarily on the treatment of chronic pain using prescription medications.  Psychiatrist - trained in conditions affecting the psychosocial wellbeing of patients  including but not limited to depression, anxiety, schizophrenia, personality disorders, addiction, and other substance use disorders that may be associated with chronic pain. They tend to rely primarily on the treatment of chronic pain using prescription medications.   Physical Medicine and Rehabilitation (PMR) physicians, also known as physiatrists - trained to treat a wide variety of medical conditions affecting the brain, spinal cord, nerves, bones, joints, ligaments, muscles, and tendons. Their training is primarily aimed at treating patients that have suffered injuries that have caused severe physical impairment. Their training is primarily aimed at the physical therapy and rehabilitation of those patients. They may also work alongside orthopedic surgeons or neurosurgeons using their expertise in assisting surgical patients to recover after their surgeries.  INTERVENTIONAL PAIN MANAGEMENT is sub-subspecialty of Pain Management.  Our physicians are Board-certified in Anesthesia, Pain Management, and Interventional Pain Management.  This meaning that not only have they been trained and Board-certified in their specialty of Anesthesia, and subspecialty of Pain Management, but they have also received further training in the sub-subspecialty of Interventional Pain Management, in order to become Board-certified as INTERVENTIONAL PAIN MANAGEMENT SPECIALIST.    Mission: Our goal is to use our skills in  INTERVENTIONAL PAIN MANAGEMENT as alternatives to the chronic use of prescription opioid medications for the treatment of pain. To make this more clear, we have changed our name to reflect what we do and offer. We will continue to offer medication management assessment and recommendations, but we will not be taking over any patient's medication management.  ______________________________________________________________________       ______________________________________________________________________     Appointment Information  It  is our goal and responsibility to provide the medical community with assistance in the evaluation and management of patients with chronic pain. Unfortunately our resources are limited. Because we do not have an unlimited amount of time, or available appointments, we are required to closely monitor for unkept or cancelled appointments.  Patient's responsibilities: 1. Punctuality: Patients are required to be physically present in our office at least 15 minutes before their scheduled appointment. 2. Tardiness: Patients not physically present in our office at their scheduled appointment time will be rescheduled. 3. Plan ahead: Assume that you will encounter traffic and plan to arrive 30 minutes before your appointment. 4. Other appointments and responsibilities: Do not schedule other appointments immediately before or after your scheduled appointment.  5. Be prepared: Make a list of everything that you need to discuss with your provider so that you use your time efficiently. Once the provider leaves your room, he/she will not return to your room to discuss anything that you neglected to bring up during your allowed time. 6. No children or pets: Do not bring children or pets to your appointment. 7. Cancelling or rescheduling your appointment: Advanced notification (more than 24 hours in advance) is required. 8. No Show: Not calling to cancel an appointment and simply not showing up is unacceptable. This leads to loss of appointments that could have been used by a patient in need. (See below)  Corrective process for repeat offenders:  No Shows: Three (3) No Shows within a 12 month period will result in an automatic discharge from our practice. Rescheduling or cancelling with more than 24 hours notice will not be penalized and will not count against you. Tardiness: If you have to be rescheduled three (3) times due to late arrivals, it will be counted as one (1) No  Show. Cancellation or reschedule: Three (3) cancellations or rescheduling where notice was given with less than 24 hours in advance, will be recorded as one (1) No Show.  Types of appointments: New patient initial evaluation: These are evaluations only. Your initial patient questionnaire will be collected and entered into the system. A history of present illness will be taken. Prior lab work, imaging studies, and associated treatments will be reviewed. The provider may order appropriate diagnostic testing depending on their evaluation and review of available information. No treatments will be started on this visit. 2nd Follow-up visit: During this visit your provider will inform you of the results of the diagnostic tests ordered on the initial evaluation. Based on the providers assessment, treatment options will be offered, at which the patient will decide if he/she is interested in the alternatives. If interested, a treatment plan will be established and started. Procedure visits: Post-procedure evaluation visits: Evaluation visits MM New problems Flare-up evaluations Follow-up after diagnostic testing ______________________________________________________________________

## 2024-02-06 NOTE — Progress Notes (Signed)
 Safety precautions to be maintained throughout the outpatient stay will include: orient to surroundings, keep bed in low position, maintain call bell within reach at all times, provide assistance with transfer out of bed and ambulation.

## 2024-02-06 NOTE — Progress Notes (Signed)
 PROVIDER NOTE: Interpretation of information contained herein should be left to medically-trained personnel. Specific patient instructions are provided elsewhere under Patient Instructions section of medical record. This document was created in part using AI and STT-dictation technology, any transcriptional errors that may result from this process are unintentional.  Patient: Maxwell Richards  Service: E/M Encounter  Provider: Eric DELENA Como, MD  DOB: 1970/09/04  Delivery: Face-to-face  Specialty: Interventional Pain Management  MRN: [id number]  Setting: Ambulatory outpatient facility  Specialty designation: 09  Type: New Patient  Location: Outpatient office facility  PCP: Douglass Ivanoff, MD  DOS: 02/06/2024    Referring Prov.: Dyane Anthony RAMAN, FNP   Primary Reason(s) for Visit: Encounter for initial evaluation of one or more chronic problems (new to examiner) potentially causing chronic pain, and posing a threat to normal musculoskeletal function. (Level of risk: High) CC: Pain (head) and Neck Pain  HPI  [name] is a 53 y.o. year old, male patient, who comes for the first time to our practice referred by Dyane Anthony RAMAN, FNP for our initial evaluation of his chronic pain. 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 Today he comes in for evaluation of his Pain (head) and Neck Pain  Pain Assessment: Location:  (occiput) Head Radiating: forward to eyes, sometimes makes my neck tight Onset: More than a month ago Duration: Chronic pain Quality: Dull, Aching Severity: 4 /10 (subjective, self-reported pain score)  Effect on ADL: denies Timing: Constant Modifying factors: heat, meds BP: (!) 149/96  HR: 65  Onset and Duration: Sudden and Present longer than 3 months Cause of pain: hit head on a bunk bed Severity: No change since onset Timing:not influenced by the time of day Aggravating Factors: irritates the pain spot Alleviating Factors: Medications and Nerve blocks Associated Problems: Pain that does not allow patient to sleep Quality of Pain: Deep, Dull, and Pressure-like Previous Examinations or Tests: MRI scan, Nerve block, X-rays, Neurological evaluation, Neurosurgical evaluation, and Chiropractic evaluation Previous Treatments: The patient denies surgery  [name] is being evaluated for possible interventional pain management therapies for the treatment of his chronic pain.  Discussed the use of AI scribe software for clinical note transcription with the patient, who gave verbal consent to proceed.  This patient was initially seen at our practice on 11/23/2016 by our nurse practitioner and on 01/24/2053 he came back for follow-up evaluation where I personally saw and examined him for the first time.  Review of initial evaluation (01/24/2017): According to the  patient's primary area of pain is in the top of his head. He admitted that at the age of 53 he admits that he suffered  a blow to this area while getting up from the lower level of a bunk bed. He admits that he tolerated the pain for several years and was seen by many eye doctors for pain behind his eyes. He admits that he did have a surgical procedure by plastic surgeon to look at the occipital nerve . He admits that it was initially going to be cut the nerve to help relieve his pain. He admits that he has numbness with radicular pain that goes around his face behind his eye. The pain also radiates down into his neck with tightness in the shoulders. He admits that he has ringing in ear. The left side is greater than the right. He denies any interventional procedures. He has had Chinese coin scraping which made the pain worse. He currently uses an activator method technique used by chiropractors. He states this is effective for a while but then returns.   His second area of pain is his neck. He admits the left is greater than the right. He states that he does radiate down into her shoulders. He denies any previous procedures, physical therapy but has had recent images.   Third area of pain is in his shoulders with left being greater than the right. He denies any previous surgeries, physical therapy. He admits that he has had x-rays of his shoulders.  On 03/01/2017 we did a diagnostic left greater occipital nerve block where he indicated that his pain had gone from a 2/10 to a 0/10 on the day of the procedure however upon his follow-up on 03/16/2017 he retracted that and indicated that he had only attained 50% relief of the pain for the duration of local anesthetic followed by a 0% improvement thereafter.  Based on his feedback, we decided to then go to the root of the occipital nerve and we blocked the third occipital nerve and the C3 medial branch on 03/24/2017.  Again, on the day of the procedure he indicated  having 8 2/10 which after the procedure went down to a 0/10.  However when he returned on 04/13/2017, he described not having attained any relief of the pain short-term or long-term.  After that the patient did not return to our clinics and apparently he went to [place]  where he had surgery in the area of the left occipital nerve and later he had a surgery to place some fatty tissue under his left upper scalp, based on the assumption that there was scar tissue in the area that was entrapping the nerve and causing some of his chronic pain.  With the advent of the electronic medical record now it is easier to find information and I located under the clinical summary of the way I will coordinate I will [facility] in [place]  that the patient had undergone a direct excision of the occipital nerve around October 2014 (02/2013).  This is information that the patient had dismissed as not being important, but it does make a huge difference since nerve excisions are rarely practiced nowadays because of the risk of developing granulomas and chronic pain in the distribution of the excised nerve, which is very likely the explanation of his ongoing pain over the distribution of the left greater occipital nerve.  History of Present Illness   Jui Chi Tetzloff is a 53 year old male who presents for pain management consultation.  He sustained a head injury five years ago, resulting in chronic occipital nerve pain. He underwent a nerve block and surgery  in [place] to remove scar tissue, which initially caused extreme pain but has improved over two years. Despite this, he continues to experience pain radiating from the occipital area.  The pain causes muscle contractions and tightness in his breathing muscles and fingers. Post-surgery, the pain persists, especially in the area where the scar tissue was removed, which remains highly sensitive. He experiences constant headaches and neck tightness, with bilateral pain  in the head and neck. The neck pain is described as tightness and is constant. There is no current numbness or weakness in the arms.  In 2014, he underwent a procedure to cut a nerve, which was ineffective as the nerve reconnected. He has not found significant relief from previous interventions and is exploring other pain management options.      In talking to this patient today it is very clear to me that he has done a lot of research regarding this pain but because of his general lack of knowledge regarding clinical neuroanatomy, he has been attempting to decide what his next treatment should be when in fact today I have explained to him that he is role is that of the patient and what he needs to do is give us  feedback regarding the questions that were asking and not attempting to make any assumptions.  I truly believe that this is the reason why the information that we obtained from his first 2 diagnostic procedures he is completely unreliable.  In addition, I also noticed that he is fixated in the and distribution of the pain that he is experiencing, not taking into account of this may be coming from a more proximal etiology such as his cervical spine.  Today's line of questioning has clearly revealed that he is experiencing constant cervical pain and headaches that are very likely to be cervicogenic in origin.  I do agree that the pain seems to be in the distribution of the greater occipital nerve, bilaterally, but this may be secondary to the problems that are occurring more proximally.  For this reason today I will be ordering some x-rays of the cervical spine and I may later expand that to an MRI.  Prior x-rays of the cervical spine have been completely negative, again suggesting the etiology to reside in the cervical region.  X-rays of the cervical spine with flexion and extension done in 2018 showed cervical degenerative disc disease with endplate degeneration at C5-6, cervical kyphosis, and chronic  bilateral C4-5 facet degeneration and bilateral C5 neuroforaminal stenosis.  Although this could explain some of the pain in the neck and upper back/shoulder region, we probably need to concentrate more on the upper cervical levels for the purpose of determining whether or not they were contributing to the headaches.  Mr. Antony has been informed that this initial visit was an evaluation only.  On the follow up appointment I will go over the results, including ordered tests and available interventional therapies. At that time he will have the opportunity to decide whether to proceed with offered therapies or not. In the event that Mr. Fidalgo prefers avoiding interventional options, this will conclude our involvement in the case.  Medication management recommendations may be provided upon request.  Patient informed that diagnostic tests may be ordered to assist in identifying underlying causes, narrow the list of differential diagnoses and aid in determining candidacy for (or contraindications to) planned therapeutic interventions.  Historic Controlled Substance Pharmacotherapy Review PMP and historical list of controlled substances: Zolpidem 10 mg tablet, 1  tab p.o. at bedtime (30/month) (# 30) (last filled on 01/19/2024); gabapentin 300 mg capsule, 1 cap p.o. twice daily (# 180) (last filled on 12/28/2023); oxycodone 5 mg, 1 tab p.o. 3 times daily (# 28) (last filled on 06/02/2023). Most recently prescribed controlled substance(s): Opioid Analgesic: None MME/day: 0 mg/day  Historical Monitoring: The patient  reports no history of drug use. List of prior UDS Testing: No results found for: MDMA, COCAINSCRNUR, PCPSCRNUR, PCPQUANT, CANNABQUANT, THCU, ETH, CBDTHCR, D8THCCBX, D9THCCBX Historical Background Evaluation: [place] PMP: PDMP reviewed during this encounter. Review of the past 22-months conducted.             PMP NARX Score Report:  Narcotic: 180 Sedative: 261 Stimulant: 000 [place]  Department of public safety, offender search: Engineer, mining Information) Non-contributory Risk Assessment Profile: Aberrant behavior: None observed or detected today Risk factors for fatal opioid overdose: None identified today PMP NARX Overdose Risk Score: 370 Fatal overdose hazard ratio (HR): Calculation deferred Non-fatal overdose hazard ratio (HR): Calculation deferred Risk of opioid abuse or dependence: 0.7-3.0% with doses <= 36 MME/day and 6.1-26% with doses >= 120 MME/day. Substance use disorder (SUD) risk level: See below Personal History of Substance Abuse (SUD-Substance use disorder):  Alcohol: Negative  Illegal Drugs: Negative  Rx Drugs: Negative  ORT Risk Level calculation: Low Risk  Opioid Risk Tool - 02/06/24 1110       Family History of Substance Abuse   Alcohol Negative    Illegal Drugs Negative    Rx Drugs Negative      Personal History of Substance Abuse   Alcohol Negative    Illegal Drugs Negative    Rx Drugs Negative      Age   Age between 16-45 years  No      History of Preadolescent Sexual Abuse   History of Preadolescent Sexual Abuse Negative or Male      Psychological Disease   Psychological Disease Negative    Depression Negative      Total Score   Opioid Risk Tool Scoring 0    Opioid Risk Interpretation Low Risk         ORT Scoring interpretation table:  Score <3 = Low Risk for SUD  Score between 4-7 = Moderate Risk for SUD  Score >8 = High Risk for Opioid Abuse   PHQ-2 Depression Scale:  Total score: 0  PHQ-2 Scoring interpretation table: (Score and probability of major depressive disorder)  Score 0 = No depression  Score 1 = 15.4% Probability  Score 2 = 21.1% Probability  Score 3 = 38.4% Probability  Score 4 = 45.5% Probability  Score 5 = 56.4% Probability  Score 6 = 78.6% Probability   PHQ-9 Depression Scale:  Total score: 0  PHQ-9 Scoring interpretation table:  Score 0-4 = No depression  Score 5-9 = Mild depression  Score 10-14  = Moderate depression  Score 15-19 = Moderately severe depression  Score 20-27 = Severe depression (2.4 times higher risk of SUD and 2.89 times higher risk of overuse)   Pharmacologic Plan: As per protocol, I have not taken over any controlled substance management, pending the results of ordered tests and/or consults.            Initial impression: Pending review of available data and ordered tests.  Meds   Current Outpatient Medications:    amLODipine (NORVASC) 5 MG tablet, Take 5 mg by mouth daily., Disp: , Rfl:    atorvastatin (LIPITOR) 40 MG tablet, Take 40 mg by mouth  daily., Disp: , Rfl:    cefadroxil (DURICEF) 500 MG capsule, Take 500 mg by mouth., Disp: , Rfl:    cyclobenzaprine (FLEXERIL) 5 MG tablet, Take 5 mg by mouth at bedtime as needed., Disp: , Rfl:    Docusate Sodium (DSS) 100 MG CAPS, Take by mouth., Disp: , Rfl:    eplerenone (INSPRA) 25 MG tablet, , Disp: , Rfl:    eplerenone (INSPRA) 25 MG tablet, Take 25 mg by mouth., Disp: , Rfl:    gabapentin (NEURONTIN) 300 MG capsule, Take by mouth., Disp: , Rfl:    naloxone (NARCAN) nasal spray 4 mg/0.1 mL, Place 1 spray into the nose., Disp: , Rfl:    neomycin-polymyxin b-dexamethasone  (MAXITROL) 3.5-10000-0.1 SUSP, SMARTSIG:In Eye(s), Disp: , Rfl:    omeprazole (PRILOSEC) 20 MG capsule, TAKE 2 CAPSULES BY MOUTH DAILY 30 MINUTES BEFORE MORNING MEAL AS NEEDED, Disp: , Rfl:    omeprazole (PRILOSEC) 20 MG capsule, 40 mg., Disp: , Rfl:    PARoxetine (PAXIL) 10 MG tablet, Take 40 mg by mouth daily., Disp: , Rfl:    PARoxetine (PAXIL) 10 MG tablet, Take 10 mg by mouth., Disp: , Rfl:    valACYclovir (VALTREX) 1000 MG tablet, Take 1,000 mg by mouth 2 (two) times daily., Disp: , Rfl:    valACYclovir (VALTREX) 500 MG tablet, Take 500 mg by mouth daily., Disp: , Rfl:    valsartan (DIOVAN) 320 MG tablet, Take 320 mg by mouth daily., Disp: , Rfl:    zolpidem (AMBIEN) 10 MG tablet, Take 10 mg by mouth at bedtime as needed., Disp: , Rfl:     amLODipine (NORVASC) 2.5 MG tablet, Take 2.5 mg by mouth., Disp: , Rfl:    atenolol (TENORMIN) 50 MG tablet, Take 50 mg by mouth 2 (two) times daily., Disp: , Rfl:    hydrOXYzine (ATARAX) 25 MG tablet, Take 25 mg by mouth daily., Disp: , Rfl:    PARoxetine (PAXIL) 20 MG tablet, Take by mouth., Disp: , Rfl:    pravastatin (PRAVACHOL) 10 MG tablet, Take 10 mg by mouth., Disp: , Rfl:   Imaging Review  Cervical Imaging: Cervical DG Bending/F/E views: Results for orders placed in visit on 01/24/17 DG Cervical Spine With Flex & Extend  Narrative CLINICAL DATA:  53 year old male with chronic cervical neck pain radiating to the shoulders more so the left. Remote injury to the back of the head in the 1990s.  EXAM: CERVICAL SPINE COMPLETE WITH FLEXION AND EXTENSION VIEWS  COMPARISON:  [facility] Cervical spine MRI 12/05/2015, radiographs 12/01/2015  FINDINGS: Chronic straightening of cervical lordosis. Prevertebral soft tissue contours remain normal. Chronic anterior endplate degeneration is stable at C5-C6. Prominent but inconsequential appearing posterior nuchal calcification.  Lateral views in neutral flexion and extension positioning. Mild focal kyphosis occurs at the C5-C6 level. Somewhat decreased range of motion demonstrated in both flexion and extension, but no other abnormal motion identified.  Bilateral posterior element alignment is within normal limits. Bilateral C4-C5 facet hypertrophy is evident. Probable associated bilateral C5 neural foraminal stenosis. Normal AP alignment. Normal C1-C2 alignment and joint spaces. Cervicothoracic junction alignment is within normal limits. Negative lung apices.  IMPRESSION: 1.  No acute osseous abnormality identified in the cervical spine. 2. 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 Mild focal kyphosis at that level with flexion is likely degenerative in nature. No other abnormal motion identified. 3. Chronic  bilateral C4-C5 facet degeneration and bilateral C5 neural foraminal stenosis suspected.   Electronically Signed By: VEAR Shona HERO.D.  On: 01/24/2017 13:48  Shoulder Imaging: Shoulder-R DG: Results for orders placed in visit on 01/24/17 DG Shoulder Right  Narrative CLINICAL DATA:  Chronic neck pain radiating into both shoulders, initial encounter  EXAM: RIGHT SHOULDER - 2+ VIEW  COMPARISON:  None.  FINDINGS: There is no evidence of fracture or dislocation. There is no evidence of arthropathy or other focal bone abnormality. Soft tissues are unremarkable.  IMPRESSION: No acute abnormality noted.   Electronically Signed By: Oneil Devonshire M.D. On: 01/24/2017 13:51  Shoulder-L DG: Results for orders placed in visit on 01/24/17 DG Shoulder Left  Narrative CLINICAL DATA:  Chronic neck pain radiating into the shoulders, no known injury, initial encounter  EXAM: LEFT SHOULDER - 2+ VIEW  COMPARISON:  None.  FINDINGS: There is no evidence of fracture or dislocation. There is no evidence of arthropathy or other focal bone abnormality. Soft tissues are unremarkable.  IMPRESSION: No acute abnormality noted.   Electronically Signed By: Oneil Devonshire M.D. On: 01/24/2017 13:49  Complexity Note: Imaging results reviewed.                         ROS  Cardiovascular: High blood pressure Pulmonary or Respiratory: Snoring  Neurological: No reported neurological signs or symptoms such as seizures, abnormal skin sensations, urinary and/or fecal incontinence, being born with an abnormal open spine and/or a tethered spinal cord Psychological-Psychiatric: Anxiousness, Depressed, Prone to panicking, and Difficulty sleeping and or falling asleep Gastrointestinal: Reflux or heatburn Genitourinary: No reported renal or genitourinary signs or symptoms such as difficulty voiding or producing urine, peeing blood, non-functioning kidney, kidney stones, difficulty emptying the bladder,  difficulty controlling the flow of urine, or chronic kidney disease Hematological: No reported hematological signs or symptoms such as prolonged bleeding, low or poor functioning platelets, bruising or bleeding easily, hereditary bleeding problems, low energy levels due to low hemoglobin or being anemic Endocrine: High blood sugar controlled without the use of insulin (NIDDM) Rheumatologic: No reported rheumatological signs and symptoms such as fatigue, joint pain, tenderness, swelling, redness, heat, stiffness, decreased range of motion, with or without associated rash Musculoskeletal: Negative for myasthenia gravis, muscular dystrophy, multiple sclerosis or malignant hyperthermia Work History: Working full time  Allergies  Mr. Weedman is allergic to cyclobenzaprine, lisinopril, other, and robaxin [methocarbamol].  Laboratory Chemistry Profile   Renal Lab Results  Component Value Date   BUN 12 03/30/2020   CREATININE 0.63 03/30/2020   BCR 13 11/23/2016   GFRAA 120 11/23/2016   GFRNONAA >60 03/30/2020   PROTEINUR NEGATIVE 03/30/2020     Electrolytes Lab Results  Component Value Date   NA 139 03/30/2020   K 3.4 (L) 03/30/2020   CL 102 03/30/2020   CALCIUM 9.1 03/30/2020   MG 2.2 11/23/2016     Hepatic Lab Results  Component Value Date   AST 27 03/30/2020   ALT 30 03/30/2020   ALBUMIN 4.7 03/30/2020   ALKPHOS 73 03/30/2020   LIPASE 26 03/30/2020     ID No results found for: LYMEIGGIGMAB, HIV, SARSCOV2NAA, STAPHAUREUS, MRSAPCR, HCVAB, PREGTESTUR, RMSFIGG, QFVRPH1IGG, QFVRPH2IGG   Bone Lab Results  Component Value Date   25OHVITD1 22 (L) 11/23/2016   25OHVITD2 <1.0 11/23/2016   25OHVITD3 22 11/23/2016     Endocrine Lab Results  Component Value Date   GLUCOSE 171 (H) 03/30/2020   GLUCOSEU 150 (A) 03/30/2020     Neuropathy Lab Results  Component Value Date   VITAMINB12 1,551 (H) 11/23/2016  CNS No results found for: COLORCSF,  APPEARCSF, RBCCOUNTCSF, WBCCSF, POLYSCSF, LYMPHSCSF, EOSCSF, PROTEINCSF, GLUCCSF, JCVIRUS, CSFOLI, IGGCSF, LABACHR, ACETBL   Inflammation (CRP: Acute  ESR: Chronic) Lab Results  Component Value Date   CRP 2.5 11/23/2016   ESRSEDRATE 7 11/23/2016     Rheumatology No results found for: RF, ANA, LABURIC, URICUR, LYMEIGGIGMAB, LYMEABIGMQN, HLAB27   Coagulation Lab Results  Component Value Date   PLT 283 03/30/2020     Cardiovascular Lab Results  Component Value Date   HGB 15.0 03/30/2020   HCT 44.5 03/30/2020     Screening No results found for: SARSCOV2NAA, COVIDSOURCE, STAPHAUREUS, MRSAPCR, HCVAB, HIV, PREGTESTUR   Cancer No results found for: Richards, CA125, LABCA2   Allergens No results found for: ALMOND, APPLE, ASPARAGUS, AVOCADO, BANANA, BARLEY, BASIL, BAYLEAF, GREENBEAN, LIMABEAN, WHITEBEAN, BEEFIGE, REDBEET, BLUEBERRY, BROCCOLI, CABBAGE, MELON, CARROT, CASEIN, CASHEWNUT, CAULIFLOWER, CELERY     Note: Lab results reviewed.  PFSH  Drug: Mr. Licausi  reports no history of drug use. Alcohol:  reports current alcohol use. Tobacco:  reports that he has never smoked. He has never used smokeless tobacco. Medical:  has a past medical history of Abdominal pain, Aldosteronism, Angina at rest, Anxiety, Chronic headaches, DM (diabetes mellitus) (HCC), History of OCD (obsessive compulsive disorder), Hyperlipidemia, Hypertension, Insomnia, Palpitations, and Panic attack. Family: family history includes Hypertension in his mother, paternal grandfather, and paternal grandmother; Supraventricular tachycardia in his father.  Past Surgical History:  Procedure Laterality Date   NASAL SINUS SURGERY  2006   Active Ambulatory Problems    Diagnosis Date Noted   Long term prescription benzodiazepine use 11/23/2016   Chronic pain syndrome 11/23/2016   Chronic neck pain (2ry area of Pain)  (Bilateral) (L>R) 11/25/2016   Chronic head pain (1ry area of Pain) (Left) 11/25/2016   Chronic shoulder pain (3ry area of Pain) (Bilateral) (L>R) 11/25/2016   Cervicogenic headache 12/28/2016   Occipital headache (Left) 12/28/2016   Radicular pain of shoulder 12/28/2016   Disorder of skeletal system 01/25/2017   Pharmacologic therapy 01/25/2017   Problems influencing health status 01/25/2017   Occipital neuralgia (Left) 03/16/2017   Laryngopharyngeal reflux (LPR) 08/20/2021   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) 09/22/2021   Pain of scalp 12/24/2022   Scar condition and fibrosis of skin 12/24/2022   Cervical facet joint arthropathy (C4-5) (Bilateral) 02/06/2024   Chronic neck and back pain (Bilateral) 02/06/2024   Cervico-occipital neuralgia 02/06/2024   Allergic rhinitis 02/06/2024   Anxiety 02/06/2024   Cholelithiasis without obstruction 02/06/2024   Gallstone 02/06/2024   Chronic prostatitis 02/06/2024   Gastroesophageal reflux disease 02/06/2024   Hardening of the aorta (main artery of the heart) 02/06/2024   History of cranial surgery 02/06/2024   Hyperlipidemia 02/06/2024   Hypertension 02/06/2024   Insomnia 02/06/2024   Kidney stone 02/06/2024   Panic attack 02/06/2024   Primary aldosteronism 02/06/2024   Recurrent major depression in full remission 02/06/2024   Type 2 diabetes mellitus with other specified complication (HCC) 02/06/2024   Chronic pain 02/06/2024   Herpes simplex virus (HSV) antibody positive 02/06/2024   Resolved Ambulatory Problems    Diagnosis Date Noted   No Resolved Ambulatory Problems   Past Medical History:  Diagnosis Date   Abdominal pain    Aldosteronism    Angina at rest    Chronic headaches    DM (diabetes mellitus) (HCC)    History of OCD (obsessive compulsive disorder)    Palpitations    Constitutional Exam  General appearance: Well nourished,  well developed, and well hydrated. In no apparent acute  distress Vitals:   02/06/24 1111  BP: (!) 149/96  Pulse: 65  Resp: 16  Temp: 98.6 F (37 C)  TempSrc: Temporal  SpO2: 99%  Weight: 190 lb 6.4 oz (86.4 kg)  Height: 5' 7.5 (1.715 m)   BMI Assessment: Estimated body mass index is 29.38 kg/m as calculated from the following:   Height as of this encounter: 5' 7.5 (1.715 m).   Weight as of this encounter: 190 lb 6.4 oz (86.4 kg).  BMI interpretation table: BMI level Category Range association with higher incidence of chronic pain  <18 kg/m2 Underweight   18.5-24.9 kg/m2 Ideal body weight   25-29.9 kg/m2 Overweight Increased incidence by 20%  30-34.9 kg/m2 Obese (Class I) Increased incidence by 68%  35-39.9 kg/m2 Severe obesity (Class II) Increased incidence by 136%  >40 kg/m2 Extreme obesity (Class III) Increased incidence by 254%   Patient's current BMI Ideal Body weight  Body mass index is 29.38 kg/m. Ideal body weight: 67.2 kg (148 lb 4.1 oz) Adjusted ideal body weight: 74.9 kg (165 lb 1.9 oz)   BMI Readings from Last 4 Encounters:  02/06/24 29.38 kg/m  04/06/22 27.40 kg/m  03/30/20 28.36 kg/m  04/13/17 27.37 kg/m   Wt Readings from Last 4 Encounters:  02/06/24 190 lb 6.4 oz (86.4 kg)  04/06/22 180 lb 3.2 oz (81.7 kg)  03/30/20 186 lb 8 oz (84.6 kg)  04/13/17 180 lb (81.6 kg)    Psych/Mental status: Alert, oriented x 3 (person, place, & time)       Eyes: PERLA Respiratory: No evidence of acute respiratory distress  Assessment  Primary Diagnosis & Pertinent Problem List: The primary encounter diagnosis was Cervical facet joint arthropathy (C4-5) (Bilateral). 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  Visit Diagnosis (New problems to examiner): 1. Cervical facet joint arthropathy (C4-5) (Bilateral)   2. Chronic head pain (1ry area of Pain) (Left)   3. Chronic neck pain (2ry area of Pain) (Bilateral) (L>R)   4. Chronic shoulder pain (3ry area of Pain) (Bilateral) (L>R)   5. Cervicogenic headache   6. Chronic neck and back pain (Bilateral)   7. Cervico-occipital neuralgia   8. 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 Mcfarren was reminded that as per protocol, today's visit has been an evaluation only. We have not taken over the patient's controlled substance management.  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 Previous surgeries included nerve excision in 2014 and scar tissue removal in 2022. Pain remains constant, bilateral, and affects the head and neck, accompanied by neck tightness. A prior nerve block was ineffective. Radiofrequency ablation is a potential treatment, offering temporary relief of 4 to 18 months. Complete pain elimination is unlikely, and realistic expectations for chronic pain management were emphasized. Discussed nerve blocks' diagnostic and therapeutic roles, including steroid use for swelling-related pain. Bilateral treatment is considered due to bilateral symptoms. Atenzia may be used for pain management. Order x-rays of the cervical spine. Provide information on  Jacquelynne. Discuss radiofrequency ablation as a treatment option. Emphasize realistic expectations for chronic pain management.  Cervical spondylosis without myelopathy or radiculopathy   Cervical spondylosis is present without myelopathy or radiculopathy. Symptoms include constant neck tightness and bilateral headache, with no numbness or weakness in the arms.  Cervicogenic headaches due to cervical facet joint issues are considered in the differential diagnosis. Order x-rays of the cervical spine to assess cervical spondylosis.       Lab Orders  No laboratory test(s) ordered today   Imaging Orders         DG Cervical Spine With Flex & Extend     Referral Orders  No referral(s) requested today   Procedure Orders    No procedure(s) ordered today   Pharmacotherapy (current): Medications ordered:  No orders of the defined types were placed in this encounter.  Medications administered during this visit: Jui Chi Mcwhirter had no medications administered during this visit.   Analgesic Pharmacotherapy:  Opioid Analgesics: For patients currently taking or requesting to take opioid analgesics, in accordance with [place]  Medical Board Guidelines, we will assess their risks and indications for the use of these substances. After completing our evaluation, we may offer recommendations, but we no longer take patients for medication management. The prescribing physician will ultimately decide, based on his/her training and level of comfort whether to adopt any of the recommendations, including whether or not to prescribe such medicines.  Membrane stabilizer: To be determined at a later time  Muscle relaxant: To be determined at a later time  NSAID: To be determined at a later time  Other analgesic(s): To be determined at a later time   Interventional management options: Mr. Bosler was informed that there is no guarantee that he would be a candidate for interventional therapies. The decision will be based on the results of diagnostic studies, as well as Mr. Trefry risk profile.  Procedure(s) under consideration:  Pending results of ordered studies     Interventional Therapies  Risk Factors  Considerations  Medical Comorbidities:     Planned  Pending:   Diagnostic/therapeutic bilateral cervical TENS unit trial (patient provided with information  regarding the TENS unit and where to obtain 1 for that trial.)  Diagnostic x-rays of the cervical spine on flexion and extension    Under consideration:   Diagnostic bilateral cervical facet MBB #1  Diagnostic bilateral greater occipital NB #1  Diagnostic bilateral C2 + TON NB #1  Diagnostic bilateral occipital nerve peripheral nerve stimulation trial #1    Completed: (Analgesic benefit)1  Diagnostic left greater occipital NB x1 (03/01/2017) (2-0/10) (50/50/0/0)  Diagnostic left C2 + TON NB x1 (03/24/2017) (2-0/10) (0/0/0/0)    Therapeutic  Palliative (PRN) options:   None established   Completed by other providers:   Surgery: Left occipital nerve excision (2014)   1(Analgesic benefit): Expressed in percentage (%). (Local anesthetic[LA] +/- sedation  L.A.Local Anesthetic  Steroid benefit  Ongoing benefit)   Provider-requested follow-up: Return in about 2 weeks (around 02/20/2024) for ( ), Eval-day (M,W), (Face2F), 2nd Visit, to review of ordered test(s).  Future Appointments  Date Time Provider Department Center  02/29/2024 12:40 PM Tanya Glisson, MD [facility] None    I discussed the assessment and treatment plan with the patient. The patient was provided an opportunity to ask questions and all were answered. The patient agreed with the plan and demonstrated an understanding of the instructions.  Patient advised to call back or seek an in-person evaluation if the symptoms or  condition worsens.  Duration of encounter: 35 minutes.  Total time on encounter, as per AMA guidelines included both the face-to-face and non-face-to-face time personally spent by the physician and/or other qualified health care professional(s) on the day of the encounter (includes time in activities that require the physician or other qualified health care professional and does not include time in activities normally performed by clinical staff). Physician's time may include the following activities  when performed: Preparing to see the patient (e.g., pre-charting review of records, searching for previously ordered imaging, lab work, and nerve conduction tests) Review of prior analgesic pharmacotherapies. Reviewing PMP Interpreting ordered tests (e.g., lab work, imaging, nerve conduction tests) Performing post-procedure evaluations, including interpretation of diagnostic procedures Obtaining and/or reviewing separately obtained history Performing a medically appropriate examination and/or evaluation Counseling and educating the patient/family/caregiver Ordering medications, tests, or procedures Referring and communicating with other health care professionals (when not separately reported) Documenting clinical information in the electronic or other health record Independently interpreting results (not separately reported) and communicating results to the patient/ family/caregiver Care coordination (not separately reported)  Note by: Eric DELENA Como, MD (TTS and AI technology used. I apologize for any typographical errors that were not detected and corrected.) Date: 02/06/2024; Time: 12:35 PM

## 2024-02-08 DIAGNOSIS — H04123 Dry eye syndrome of bilateral lacrimal glands: Secondary | ICD-10-CM | POA: Diagnosis not present

## 2024-02-08 DIAGNOSIS — H16102 Unspecified superficial keratitis, left eye: Secondary | ICD-10-CM | POA: Diagnosis not present

## 2024-02-29 ENCOUNTER — Ambulatory Visit: Admitting: Pain Medicine

## 2024-04-09 DIAGNOSIS — I10 Essential (primary) hypertension: Secondary | ICD-10-CM | POA: Diagnosis not present

## 2024-04-09 DIAGNOSIS — E785 Hyperlipidemia, unspecified: Secondary | ICD-10-CM | POA: Diagnosis not present

## 2024-04-09 DIAGNOSIS — E1169 Type 2 diabetes mellitus with other specified complication: Secondary | ICD-10-CM | POA: Diagnosis not present

## 2024-04-09 DIAGNOSIS — Z0000 Encounter for general adult medical examination without abnormal findings: Secondary | ICD-10-CM | POA: Diagnosis not present

## 2024-04-20 DIAGNOSIS — M5481 Occipital neuralgia: Secondary | ICD-10-CM | POA: Diagnosis not present
